# Patient Record
Sex: Male | Born: 1940 | Race: White | Hispanic: No | Marital: Married | Smoking: Former smoker
Health system: Southern US, Community
[De-identification: ages and names within clinical notes are randomized; demographics above are authoritative.]

## PROBLEM LIST (undated history)

## (undated) DIAGNOSIS — F1010 Alcohol abuse, uncomplicated: Secondary | ICD-10-CM

## (undated) DIAGNOSIS — R918 Other nonspecific abnormal finding of lung field: Secondary | ICD-10-CM

## (undated) DIAGNOSIS — I10 Essential (primary) hypertension: Secondary | ICD-10-CM

## (undated) DIAGNOSIS — H9190 Unspecified hearing loss, unspecified ear: Secondary | ICD-10-CM

## (undated) DIAGNOSIS — Z5111 Encounter for antineoplastic chemotherapy: Secondary | ICD-10-CM

## (undated) DIAGNOSIS — C801 Malignant (primary) neoplasm, unspecified: Secondary | ICD-10-CM

## (undated) HISTORY — DX: Alcohol abuse, uncomplicated: F10.10

## (undated) HISTORY — PX: HERNIA REPAIR: SHX51

## (undated) HISTORY — DX: Encounter for antineoplastic chemotherapy: Z51.11

## (undated) HISTORY — DX: Malignant (primary) neoplasm, unspecified: C80.1

## (undated) HISTORY — DX: Other nonspecific abnormal finding of lung field: R91.8

## (undated) HISTORY — DX: Unspecified hearing loss, unspecified ear: H91.90

---

## 2002-05-21 ENCOUNTER — Encounter: Payer: Self-pay | Admitting: Emergency Medicine

## 2002-05-22 ENCOUNTER — Inpatient Hospital Stay (HOSPITAL_COMMUNITY): Admission: EM | Admit: 2002-05-22 | Discharge: 2002-05-23 | Payer: Self-pay | Admitting: Emergency Medicine

## 2003-06-13 ENCOUNTER — Encounter: Admission: RE | Admit: 2003-06-13 | Discharge: 2003-06-13 | Payer: Self-pay | Admitting: Otolaryngology

## 2003-06-30 ENCOUNTER — Ambulatory Visit (HOSPITAL_COMMUNITY): Admission: RE | Admit: 2003-06-30 | Discharge: 2003-06-30 | Payer: Self-pay | Admitting: Otolaryngology

## 2003-08-21 ENCOUNTER — Emergency Department (HOSPITAL_COMMUNITY): Admission: EM | Admit: 2003-08-21 | Discharge: 2003-08-21 | Payer: Self-pay | Admitting: Emergency Medicine

## 2003-09-22 ENCOUNTER — Ambulatory Visit (HOSPITAL_COMMUNITY): Admission: RE | Admit: 2003-09-22 | Discharge: 2003-09-22 | Payer: Self-pay | Admitting: Gastroenterology

## 2003-10-29 ENCOUNTER — Ambulatory Visit: Payer: Self-pay | Admitting: Internal Medicine

## 2003-12-30 ENCOUNTER — Ambulatory Visit: Payer: Self-pay | Admitting: Internal Medicine

## 2003-12-30 ENCOUNTER — Ambulatory Visit (HOSPITAL_COMMUNITY): Admission: RE | Admit: 2003-12-30 | Discharge: 2003-12-30 | Payer: Self-pay | Admitting: Internal Medicine

## 2004-02-09 ENCOUNTER — Ambulatory Visit: Payer: Self-pay | Admitting: Internal Medicine

## 2004-02-20 ENCOUNTER — Ambulatory Visit: Payer: Self-pay | Admitting: Internal Medicine

## 2012-05-28 ENCOUNTER — Emergency Department (HOSPITAL_COMMUNITY)
Admission: EM | Admit: 2012-05-28 | Discharge: 2012-05-28 | Disposition: A | Discharge status: Home / Self Care | Payer: BLUE CROSS/BLUE SHIELD | Source: Home / Self Care

## 2012-05-29 ENCOUNTER — Emergency Department (HOSPITAL_COMMUNITY)
Admission: EM | Admit: 2012-05-29 | Discharge: 2012-05-29 | Disposition: A | Discharge status: Home / Self Care | Payer: BLUE CROSS/BLUE SHIELD | Source: Home / Self Care | Attending: Family Medicine | Admitting: Family Medicine

## 2012-05-29 ENCOUNTER — Encounter (HOSPITAL_COMMUNITY): Payer: Self-pay

## 2012-05-29 DIAGNOSIS — M1990 Unspecified osteoarthritis, unspecified site: Secondary | ICD-10-CM | POA: Diagnosis present

## 2012-05-29 LAB — COMPREHENSIVE METABOLIC PANEL
ALT: 25 U/L (ref 0–53)
AST: 20 U/L (ref 0–37)
Albumin: 3.9 g/dL (ref 3.5–5.2)
Alkaline Phosphatase: 49 U/L (ref 39–117)
BUN: 20 mg/dL (ref 6–23)
CO2: 27 mEq/L (ref 19–32)
Calcium: 9.3 mg/dL (ref 8.4–10.5)
Chloride: 104 mEq/L (ref 96–112)
Creatinine, Ser: 0.86 mg/dL (ref 0.50–1.35)
GFR calc Af Amer: >90 mL/min (ref >90)
GFR calc non Af Amer: 83 mL/min — ABNORMAL LOW (ref >90)
Glucose, Bld: 97 mg/dL (ref 70–99)
Potassium: 4.3 mEq/L (ref 3.5–5.1)
Sodium: 139 mEq/L (ref 135–145)
Total Bilirubin: 0.3 mg/dL (ref 0.3–1.2)
Total Protein: 7.6 g/dL (ref 6.0–8.3)

## 2012-05-29 LAB — CBC
HCT: 43.8 % (ref 39.0–52.0)
Hemoglobin: 15.4 g/dL (ref 13.0–17.0)
MCH: 30.3 pg (ref 26.0–34.0)
MCHC: 35.2 g/dL (ref 30.0–36.0)
MCV: 86.2 fL (ref 78.0–100.0)
Platelets: 287 10*3/uL (ref 150–400)
RBC: 5.08 MIL/uL (ref 4.22–5.81)
RDW: 13.6 % (ref 11.5–15.5)
WBC: 9.3 10*3/uL (ref 4.0–10.5)

## 2012-05-29 LAB — SEDIMENTATION RATE: Sed Rate: 7 mm/hr (ref 0–16)

## 2012-05-29 MED ORDER — TRAMADOL-ACETAMINOPHEN 37.5-325 MG PO TABS: 1.0000 | ORAL_TABLET | Freq: Three times a day (TID) | ORAL | Status: DC | PRN

## 2012-05-29 NOTE — ED Provider Notes (Addendum)
History    CSN: [id number]  Arrival date & time 05/29/12  1628   First MD Initiated Contact with Patient 05/29/12 1800     Chief Complaint  Patient presents with  . Arthritis    (Consider location/radiation/quality/duration/timing/severity/associated sxs/prior treatment) HPI Pt reports that he has difficult with arthritis pain that he has been having for many years.  He is not taking any medications for it at this time but reports that he did take medications in the past.  He says that he had been going to [facility] in the past but reports that he is not going there any more.  Pain is worse in lower back, hips, fingers, wrist and hands.   History reviewed. No pertinent past medical history.  History reviewed. No pertinent past surgical history.  No family history on file.  History  Substance Use Topics  . Smoking status: Not on file  . Smokeless tobacco: Not on file  . Alcohol Use: Not on file    Review of Systems  Musculoskeletal: Positive for back pain, joint swelling and arthralgias.  All other systems reviewed and are negative.    Allergies  Review of patient's allergies indicates no known allergies.  Home Medications  No current outpatient prescriptions on file.  BP 136/74  Pulse 81  Temp(Src) 98.7 F (37.1 C) (Oral)  Resp 18  SpO2 95%  Physical Exam Nursing note and vitals reviewed.  Constitutional: He is oriented to person, place, and time. He appears well-developed and well-nourished. No distress.  Eyes: Conjunctivae and EOM are normal. Pupils are equal, round, and reactive to light.  Neck: Normal range of motion. Neck supple. No JVD present. No thyromegaly present.  Cardiovascular: Normal rate, regular rhythm and normal heart sounds.  No murmur heard.  Pulmonary/Chest: Effort normal and breath sounds normal. No respiratory distress.  Abdominal: Soft. Bowel sounds are normal.  Musculoskeletal: pain in lower back and arthritic  changes in fingers and hands  Lymphadenopathy:  He has no cervical adenopathy.  Neurological: He is oriented to person, place, and time. Coordination normal.  Skin: Skin is warm and dry. No rash noted. No erythema. No pallor.  Psychiatric: He has a normal mood and affect. His behavior is normal. Judgment and thought content normal.    ED Course  Procedures (including critical care time)  Labs Reviewed - No data to display No results found.   No diagnosis found.  MDM  IMPRESSION  Osteoarthritis Pain - diffuse   RECOMMENDATIONS / PLAN Trial of ultracet 37.5/325  po every 12 hours prn severe pain Check labs today  Counseling and written information provided   FOLLOW UP 1 month   The patient was given clear instructions to go to ER or return to medical center if symptoms don't improve, worsen or new problems develop.  The patient verbalized understanding.  The patient was told to call to get lab results if they haven't heard anything in the next week.            Cleora Fleet, MD 05/29/12 1807  Cleora Fleet, MD 05/29/12 [phone]

## 2012-05-29 NOTE — ED Notes (Signed)
Patient is here for history of arthritis

## 2012-05-30 ENCOUNTER — Telehealth (HOSPITAL_COMMUNITY): Payer: Self-pay

## 2012-05-30 NOTE — Progress Notes (Signed)
Quick Note:  Please inform patient that his labs came back normal.   Rodney Langton, MD, CDE, FAAFP [facility] [place], [place]   ______

## 2012-05-30 NOTE — ED Notes (Signed)
Lab results given

## 2012-06-18 MED ORDER — TRAMADOL-ACETAMINOPHEN 37.5-325 MG PO TABS: 1.0000 | ORAL_TABLET | Freq: Three times a day (TID) | ORAL | Status: DC | PRN

## 2012-06-28 ENCOUNTER — Emergency Department (HOSPITAL_COMMUNITY)
Admission: EM | Admit: 2012-06-28 | Discharge: 2012-06-28 | Disposition: A | Discharge status: Home Health | Payer: BLUE CROSS/BLUE SHIELD | Source: Home / Self Care

## 2012-06-28 ENCOUNTER — Encounter (HOSPITAL_COMMUNITY): Payer: Self-pay

## 2012-06-28 DIAGNOSIS — IMO0002 Reserved for concepts with insufficient information to code with codable children: Secondary | ICD-10-CM

## 2012-06-28 DIAGNOSIS — M5416 Radiculopathy, lumbar region: Secondary | ICD-10-CM

## 2012-06-28 DIAGNOSIS — M1990 Unspecified osteoarthritis, unspecified site: Secondary | ICD-10-CM

## 2012-06-28 NOTE — ED Provider Notes (Signed)
History     CSN: [id number]  Arrival date & time 06/28/12  1610   None     No chief complaint on file.  72 year old male who presents to [facility] for followup of his arthritis. The patient primarily had pain in his back and is bilateral lower extremities. The patient has been taking Ultracet for the pain at least one to 2 tablets a day and he feels a lot better. The patient states that he has been more active in the last couple of weeks which has also helped him with the pain. He is trying to bicycle on a daily basis. He states that he has no other complaints    HPI  History reviewed. No pertinent past medical history.  History reviewed. No pertinent past surgical history.  History reviewed. No pertinent family history.  History  Substance Use Topics  . Smoking status: Not on file  . Smokeless tobacco: Not on file  . Alcohol Use: Not on file      Review of Systems As in history of present illness Allergies  Review of patient's allergies indicates no known allergies.  Home Medications   Current Outpatient Rx  Name  Route  Sig  Dispense  Refill  . traMADol-acetaminophen (ULTRACET) 37.5-325 MG per tablet   Oral   Take 1 tablet by mouth every 8 (eight) hours as needed for pain.   30 tablet   2     BP 120/68  Pulse 76  Temp(Src) 98.6 F (37 C) (Oral)  Resp 20  SpO2 96%  Physical Exam Cardiovascular: Normal rate, regular rhythm and normal heart sounds.  No murmur heard.  Pulmonary/Chest: Effort normal and breath sounds normal. No respiratory distress.  Abdominal: Soft. Bowel sounds are normal.  Musculoskeletal: pain in lower back and arthritic changes in fingers and hands  Lymphadenopathy:  He has no cervical adenopathy.  Neurological: He is oriented to person, place, and time. Coordination normal.  Skin: Skin is warm and dry. No rash noted. No erythema. No pallor.  Psychiatric: He has a normal mood and affect. His behavior is normal. Judgment and thought content  normal.     ED Course  Procedures (including critical care time)  Labs Reviewed - No data to display No results found.   1. Lumbar radiculopathy, chronic   2. Osteoarthritis       MDM  Plan Continue Ultracet Patient has 2 refills on this medication Routine followup in 2 months as the patient is doing well        Richarda Overlie, MD 06/28/12 1714

## 2012-06-28 NOTE — ED Notes (Signed)
Follow up for back and leg pain. Patient stated he feel much better pain medication helps a lot.

## 2012-10-10 ENCOUNTER — Other Ambulatory Visit: Payer: Self-pay | Admitting: Internal Medicine

## 2012-10-10 ENCOUNTER — Telehealth: Payer: Self-pay | Admitting: Internal Medicine

## 2012-10-10 MED ORDER — TRAMADOL-ACETAMINOPHEN 37.5-325 MG PO TABS: 1.0000 | ORAL_TABLET | Freq: Three times a day (TID) | ORAL | Status: DC | PRN

## 2012-10-10 NOTE — Telephone Encounter (Signed)
Pt here, would like refill for traMADol-acetaminophen (ULTRACET) 37.5-325 MG per tablet.  Pt last seen on 06/28/12.

## 2012-10-18 ENCOUNTER — Encounter: Payer: Self-pay | Admitting: Internal Medicine

## 2012-10-18 ENCOUNTER — Ambulatory Visit: Payer: Medicare Other | Attending: Internal Medicine | Admitting: Internal Medicine

## 2012-10-18 VITALS — BP 140/81 | HR 79 | Temp 98.6°F | Resp 16 | Ht 69.0 in | Wt 168.0 lb

## 2012-10-18 DIAGNOSIS — M545 Low back pain, unspecified: Secondary | ICD-10-CM | POA: Insufficient documentation

## 2012-10-18 DIAGNOSIS — G8929 Other chronic pain: Secondary | ICD-10-CM | POA: Insufficient documentation

## 2012-10-18 MED ORDER — TRAMADOL-ACETAMINOPHEN 37.5-325 MG PO TABS: 1.0000 | ORAL_TABLET | Freq: Three times a day (TID) | ORAL | Status: DC | PRN

## 2012-10-18 NOTE — Progress Notes (Signed)
PT HERE WITH C/O CHRONIC HIP PAIN RADIATING ALL OVER  HX OSTEOARTHRITIS

## 2012-10-18 NOTE — Progress Notes (Signed)
Patient ID: Derek Howard, male   DOB: 05/18/1940, 72 y.o.   MRN: [id number]  CC: Followup  HPI: Derek Howard is 72 years old gentleman who came to clinic today for refill of his medications. He has history of arthritis and chronic low back pain for which she's been on medication for long time. He has no significant medical history of hypertension or diabetes. He lives at home with his wife. Denies any new symptoms, denies any weakness or numbness. He ran out of medication and the pain has been keeping him awake lately.  No Known Allergies History reviewed. No pertinent past medical history. No current outpatient prescriptions on file prior to visit.   No current facility-administered medications on file prior to visit.   History reviewed. No pertinent family history. History   Social History  . Marital Status: Single    Spouse Name: N/A    Number of Children: N/A  . Years of Education: N/A   Occupational History  . Not on file.   Social History Main Topics  . Smoking status: Never Smoker   . Smokeless tobacco: Not on file  . Alcohol Use: No  . Drug Use: No  . Sexual Activity: Not on file   Other Topics Concern  . Not on file   Social History Narrative  . No narrative on file    Review of Systems: Constitutional: Negative for fever, chills, diaphoresis, activity change, appetite change and fatigue. HENT: Negative for ear pain, nosebleeds, congestion, facial swelling, rhinorrhea, neck pain, neck stiffness and ear discharge.  Eyes: Negative for pain, discharge, redness, itching and visual disturbance. Respiratory: Negative for cough, choking, chest tightness, shortness of breath, wheezing and stridor.  Cardiovascular: Negative for chest pain, palpitations and leg swelling. Gastrointestinal: Negative for abdominal distention. Genitourinary: Negative for dysuria, urgency, frequency, hematuria, flank pain, decreased urine volume, difficulty urinating and dyspareunia.   Musculoskeletal: Negative for back pain, joint swelling, arthralgias and gait problem. Neurological: Negative for dizziness, tremors, seizures, syncope, facial asymmetry, speech difficulty, weakness, light-headedness, numbness and headaches.  Hematological: Negative for adenopathy. Does not bruise/bleed easily. Psychiatric/Behavioral: Negative for hallucinations, behavioral problems, confusion, dysphoric mood, decreased concentration and agitation.    Objective:   Filed Vitals:   10/18/12 1537  BP: 140/81  Pulse: 79  Temp: 98.6 F (37 C)  Resp: 16    Physical Exam: Constitutional: Patient appears well-developed and well-nourished. No distress. HENT: Normocephalic, atraumatic, External right and left ear normal. Oropharynx is clear and moist.  Eyes: Conjunctivae and EOM are normal. PERRLA, no scleral icterus. Neck: Normal ROM. Neck supple. No JVD. No tracheal deviation. No thyromegaly. CVS: RRR, S1/S2 +, no murmurs, no gallops, no carotid bruit.  Pulmonary: Effort and breath sounds normal, no stridor, rhonchi, wheezes, rales.  Abdominal: Soft. BS +,  no distension, tenderness, rebound or guarding.  Musculoskeletal: Normal range of motion. No edema and no tenderness.  Lymphadenopathy: No lymphadenopathy noted, cervical, inguinal or axillary Neuro: Alert. Normal reflexes, muscle tone coordination. No cranial nerve deficit. Skin: Skin is warm and dry. No rash noted. Not diaphoretic. No erythema. No pallor. Psychiatric: Normal mood and affect. Behavior, judgment, thought content normal.  Lab Results  Component Value Date   WBC 9.3 05/29/2012   HGB 15.4 05/29/2012   HCT 43.8 05/29/2012   MCV 86.2 05/29/2012   PLT 287 05/29/2012   Lab Results  Component Value Date   CREATININE 0.86 05/29/2012   BUN 20 05/29/2012   NA 139 05/29/2012   K 4.3  05/29/2012   CL 104 05/29/2012   CO2 27 05/29/2012    No results found for this basename: HGBA1C   Lipid Panel  No results found for this basename: chol,  trig, hdl, cholhdl, vldl, ldlcalc       Assessment and plan:   Patient Active Problem List   Diagnosis Date Noted  . Chronic low back pain 10/18/2012  . Osteoarthritis 05/29/2012   Refill medication trauma-acetaminophen 37.5/325 mg per tab use 1 tablet Q8 hour when necessary for pain Patient extensively counseled about this medication and its side effects Patient encouraged to do exercise as tolerated  Derek Howard was given clear instructions to go to ER or return to the clinic if symptoms don't improve, worsen or new problems develop.  Derek Howard verbalized understanding.  Derek Howard was told to call to get lab results if hasn't heard anything in the next week.        Jeanann Lewandowsky, MD [facility] [place], [place] [phone]   10/18/2012, 4:28 PM

## 2012-11-14 NOTE — Telephone Encounter (Signed)
Office visit on 10/18/12.

## 2013-10-16 ENCOUNTER — Other Ambulatory Visit: Payer: Self-pay | Admitting: Internal Medicine

## 2013-10-17 ENCOUNTER — Telehealth: Payer: Self-pay

## 2013-10-17 NOTE — Telephone Encounter (Signed)
Patient calling requesting a refill on his tramadol Can we refill this?

## 2013-10-18 ENCOUNTER — Ambulatory Visit (INDEPENDENT_AMBULATORY_CARE_PROVIDER_SITE_OTHER): Payer: Medicare Other

## 2013-10-18 ENCOUNTER — Ambulatory Visit (INDEPENDENT_AMBULATORY_CARE_PROVIDER_SITE_OTHER): Payer: Medicare Other | Admitting: Family Medicine

## 2013-10-18 ENCOUNTER — Telehealth: Payer: Self-pay | Admitting: Internal Medicine

## 2013-10-18 VITALS — BP 128/84 | HR 84 | Temp 98.7°F | Resp 16 | Ht 66.5 in | Wt 167.0 lb

## 2013-10-18 DIAGNOSIS — M5136 Other intervertebral disc degeneration, lumbar region: Secondary | ICD-10-CM

## 2013-10-18 DIAGNOSIS — M545 Low back pain, unspecified: Secondary | ICD-10-CM

## 2013-10-18 DIAGNOSIS — M25551 Pain in right hip: Secondary | ICD-10-CM

## 2013-10-18 DIAGNOSIS — C4491 Basal cell carcinoma of skin, unspecified: Secondary | ICD-10-CM

## 2013-10-18 DIAGNOSIS — M51379 Other intervertebral disc degeneration, lumbosacral region without mention of lumbar back pain or lower extremity pain: Secondary | ICD-10-CM

## 2013-10-18 DIAGNOSIS — M25559 Pain in unspecified hip: Secondary | ICD-10-CM

## 2013-10-18 DIAGNOSIS — M5137 Other intervertebral disc degeneration, lumbosacral region: Secondary | ICD-10-CM

## 2013-10-18 DIAGNOSIS — L989 Disorder of the skin and subcutaneous tissue, unspecified: Secondary | ICD-10-CM

## 2013-10-18 DIAGNOSIS — G8929 Other chronic pain: Secondary | ICD-10-CM

## 2013-10-18 MED ORDER — TRAMADOL-ACETAMINOPHEN 37.5-325 MG PO TABS: 1.0000 | ORAL_TABLET | Freq: Three times a day (TID) | ORAL | Status: DC | PRN

## 2013-10-18 MED ORDER — OMEPRAZOLE 40 MG PO CPDR: 40.0000 mg | DELAYED_RELEASE_CAPSULE | Freq: Every day | ORAL | Status: DC

## 2013-10-18 NOTE — Patient Instructions (Signed)
1.  IT IS VERY IMPORTANT TO FOLLOW-UP WITH YOUR PRIMARY CARE PHYSICIAN IN THE UPCOMING TWO WEEKS. 2. YOU NEED TO SCHEDULE AN ANNUAL WELLNESS EXAM WITH YOUR PRIMARY CARE PROVIDER.

## 2013-10-18 NOTE — Progress Notes (Signed)
Subjective:    Patient ID: Derek Howard, male    DOB: 28-Jun-1940, 74 y.o.   MRN: [id number]  10/18/2013  Knee Pain and Back Pain   HPI This 74 y.o. male presents for evaluation of chronic lower back pain and R hip pain and B knee pain.  Taking Tramadol/APAP 37.5-325mg  one as needed for pain.  OA hips and back and knees.  Followed at [facility] for primary care but last appointment with [facility] one year ago; attempted to be seen today by PCP but no appointments available; has been scheduled for 10-29-13 with PCP.  Having horrible nighttime pain in lower back; wife reports that pt has not slept in three nights.  Worsening lower back pain for over one year. Denies radiation into legs; no n/t/w in legs; no saddle paresthesias; no b/b dysfunction but does report nocturia frequently.    No orthopedist evaluation in past..  Has suffered with OA for ten or fifteen years. Last ortho evaluation 1997 or 1998.  Last xrays of lower back fifteen years ago.  Presents with handicap sticker for completion.  Basal cell carcinoma:  Has had several skin cancers removed from various locations.  No specific dermatologist.  Not followed regularly by dermatology.  Has growth on nose that pt states he is sure is a skin cancer; needs to undergo repeat dermatology evaluation.    Health Maintenance: does not go for regular annual wellness examinations.     Review of Systems  Constitutional: Negative for fever, chills, diaphoresis and fatigue.  HENT: Positive for hearing loss.   Genitourinary: Positive for frequency.  Musculoskeletal: Positive for arthralgias, back pain, gait problem and myalgias. Negative for joint swelling, neck pain and neck stiffness.  Skin: Positive for wound.  Neurological: Negative for weakness and numbness.    Past Medical History  Diagnosis Date  . Cancer     basal cell carcinoma  . Hearing loss    History reviewed. No pertinent past surgical history. No Known Allergies Current  Outpatient Prescriptions  Medication Sig Dispense Refill  . omeprazole (PRILOSEC) 40 MG capsule Take 1 capsule (40 mg total) by mouth daily.  30 capsule  3  . traMADol-acetaminophen (ULTRACET) 37.5-325 MG per tablet Take 1 tablet by mouth every 8 (eight) hours as needed.  60 tablet  0   No current facility-administered medications for this visit.   History   Social History  . Marital Status: Married    Spouse Name: N/A    Number of Children: N/A  . Years of Education: N/A   Occupational History  . Not on file.   Social History Main Topics  . Smoking status: Never Smoker   . Smokeless tobacco: Not on file  . Alcohol Use: No  . Drug Use: No  . Sexual Activity: Not on file   Other Topics Concern  . Not on file   Social History Narrative   Marital status:  Married x 1979.      Children:  2 children      Lives: with wife, one child (46)     Employment:  Retired      Tobacco:  None; quit       Alcohol:  None             Objective:    BP 128/84  Pulse 84  Temp(Src) 98.7 F (37.1 C)  Resp 16  Ht 5' 6.5" (1.689 m)  Wt 167 lb (75.751 kg)  BMI 26.55 kg/m2  SpO2  98% Physical Exam  Nursing note and vitals reviewed. Constitutional: He is oriented to person, place, and time. He appears well-developed and well-nourished. No distress.  HENT:  Head: Normocephalic and atraumatic.  Eyes: Conjunctivae and EOM are normal. Pupils are equal, round, and reactive to light.  Neck: Normal range of motion. Neck supple. Carotid bruit is not present. No thyromegaly present.  Cardiovascular: Normal rate, regular rhythm, normal heart sounds and intact distal pulses.  Exam reveals no gallop and no friction rub.   No murmur heard. Pulmonary/Chest: Effort normal and breath sounds normal. He has no wheezes. He has no rales.  Musculoskeletal:       Right hip: He exhibits normal range of motion, normal strength, no tenderness, no bony tenderness and no swelling.       Right knee: He exhibits  normal range of motion, no swelling and no effusion. No tenderness found. No medial joint line and no lateral joint line tenderness noted.       Left knee: He exhibits normal range of motion, no swelling and no effusion. No tenderness found. No medial joint line and no lateral joint line tenderness noted.       Lumbar back: He exhibits decreased range of motion and pain. He exhibits no tenderness, no bony tenderness, no spasm and normal pulse.  Lymphadenopathy:    He has cervical adenopathy.       Left cervical: Superficial cervical adenopathy present. No posterior cervical adenopathy present.  Neurological: He is alert and oriented to person, place, and time. No cranial nerve deficit.  Skin: Skin is warm and dry. Lesion noted. No rash noted. He is not diaphoretic. No pallor.  Distal nose with 3mm diameter growth with central eschar 68mm. No active bleeding.   Psychiatric: He has a normal mood and affect. His behavior is normal.   Results for orders placed during the hospital encounter of 05/29/12  CBC      Result Value Ref Range   WBC 9.3  4.0 - 10.5 K/uL   RBC 5.08  4.22 - 5.81 MIL/uL   Hemoglobin 15.4  13.0 - 17.0 g/dL   HCT 43.8  39.0 - 52.0 %   MCV 86.2  78.0 - 100.0 fL   MCH 30.3  26.0 - 34.0 pg   MCHC 35.2  30.0 - 36.0 g/dL   RDW 13.6  11.5 - 15.5 %   Platelets 287  150 - 400 K/uL  COMPREHENSIVE METABOLIC PANEL      Result Value Ref Range   Sodium 139  135 - 145 mEq/L   Potassium 4.3  3.5 - 5.1 mEq/L   Chloride 104  96 - 112 mEq/L   CO2 27  19 - 32 mEq/L   Glucose, Bld 97  70 - 99 mg/dL   BUN 20  6 - 23 mg/dL   Creatinine, Ser 0.86  0.50 - 1.35 mg/dL   Calcium 9.3  8.4 - 10.5 mg/dL   Total Protein 7.6  6.0 - 8.3 g/dL   Albumin 3.9  3.5 - 5.2 g/dL   AST 20  0 - 37 U/L   ALT 25  0 - 53 U/L   Alkaline Phosphatase 49  39 - 117 U/L   Total Bilirubin 0.3  0.3 - 1.2 mg/dL   GFR calc non Af Amer 83 (*) >90 mL/min   GFR calc Af Amer >90  >90 mL/min  SEDIMENTATION RATE       Result Value Ref Range   Sed Rate  7  0 - 16 mm/hr   [facility] reading (PRIMARY) by  Dr. Tamala Julian.  LUMBAR SPINE: MODERATE TO SEVERE SPURRING; DEGENERATIVE CHANGES.  L HIP: MILD TO MODERATE  ARTHRITIC CHANGES; MINIMAL R HIP ARTHRITIC CHANGES.      Assessment & Plan:   1. Hip pain, right   2. Chronic low back pain   3. Non-healing skin lesion of nose   4. Basal cell carcinoma   5. Degenerative disc disease, lumbar    1 Chronic lower back pain:  Worsening in the past year.  Refill of Ultracet provided. Refer to ortho.  Has appointment with PCP in upcoming two weeks; further refills of Ultracet will need to be prescribed by PCP.  Advised pt that he will warrant regular follow-up with PCP due to chronic pain and need for ongoing rx. 2.  R hip pain with OA B hips:  New.  Rx for Ultracet provided; good range of motion; refer to ortho. 3.  DDD lumbar spine: moderate to severe; refer to ortho. 4.  Non-healing skin lesion nose:  New. Concerning for cancer; refer to dermatology. 5.  Basal cell carcinoma: history of multiple basal cell cancer resections; no regular follow-up with dermatology; refer to derm. 6. Health Maintenance: highly recommend undergoing Annual Wellness Exam by PCP; does not appear to be up to date with health maintenance.  Meds ordered this encounter  Medications  . omeprazole (PRILOSEC) 40 MG capsule    Sig: Take 1 capsule (40 mg total) by mouth daily.    Dispense:  30 capsule    Refill:  3  . traMADol-acetaminophen (ULTRACET) 37.5-325 MG per tablet    Sig: Take 1 tablet by mouth every 8 (eight) hours as needed.    Dispense:  60 tablet    Refill:  0    Return if symptoms worsen or fail to improve.  Reginia Forts, M.D.  [facility] [address] [place], [place]  [address] [phone] phone [phone] fax

## 2013-10-18 NOTE — Telephone Encounter (Signed)
Pt. Came in as a walk in and was unable to be seen by triage nurse...pt. Has not been since in clinic since 10/18/12....pt. Was upset and was given an appt. For 10/29/13 at 12 with Dr. Annitta Needs...pt. Was requesting pain medication.Derek Howard

## 2013-10-19 ENCOUNTER — Encounter: Payer: Self-pay | Admitting: Family Medicine

## 2013-10-29 ENCOUNTER — Ambulatory Visit: Payer: Medicare Other | Attending: Internal Medicine | Admitting: Internal Medicine

## 2013-10-29 ENCOUNTER — Encounter: Payer: Self-pay | Admitting: Internal Medicine

## 2013-10-29 VITALS — BP 137/74 | HR 76 | Temp 98.3°F | Wt 164.4 lb

## 2013-10-29 DIAGNOSIS — M1610 Unilateral primary osteoarthritis, unspecified hip: Secondary | ICD-10-CM

## 2013-10-29 DIAGNOSIS — M169 Osteoarthritis of hip, unspecified: Secondary | ICD-10-CM

## 2013-10-29 DIAGNOSIS — M47816 Spondylosis without myelopathy or radiculopathy, lumbar region: Secondary | ICD-10-CM

## 2013-10-29 DIAGNOSIS — M1612 Unilateral primary osteoarthritis, left hip: Secondary | ICD-10-CM

## 2013-10-29 DIAGNOSIS — M545 Low back pain, unspecified: Secondary | ICD-10-CM

## 2013-10-29 DIAGNOSIS — G8929 Other chronic pain: Secondary | ICD-10-CM

## 2013-10-29 DIAGNOSIS — M47817 Spondylosis without myelopathy or radiculopathy, lumbosacral region: Secondary | ICD-10-CM

## 2013-10-29 MED ORDER — IBUPROFEN 600 MG PO TABS: 600.0000 mg | ORAL_TABLET | Freq: Three times a day (TID) | ORAL | Status: DC | PRN

## 2013-10-29 NOTE — Progress Notes (Signed)
Patient is here today for a pain medication follow up. State that his pain is fairly severe 8/10 He has a hanidcap form and a cd of his disc for doctor to address.

## 2013-10-29 NOTE — Progress Notes (Signed)
MRN: [id number] Name: Derek Howard  Sex: male Age: 73 y.o. DOB: [birth year]  Allergies: Review of patient's allergies indicates no known allergies.  Chief Complaint  Patient presents with  . Medication Refill    HPI: Patient is 73 y.o. male who has history of chronic low back pain osteoarthritis comes today requesting pain medication, EMR reviewed [facility], had x-ray of lower back as well as hips done reported lower lumbar DJD and osteoarthritis of left hip, patient was prescribed Ultracet and is not due for refill, patient also wants the form to be filled for handicap sticker, patient has not seen any orthopedics, he was advised to call and make an appointment, also patient is scheduled to follow with a dermatologist, patient denies any incontinence numbness or weakness.  Past Medical History  Diagnosis Date  . Cancer     basal cell carcinoma  . Hearing loss     No past surgical history on file.    Medication List       This list is accurate as of: 10/29/13 12:34 PM.  Always use your most recent med list.               ibuprofen 600 MG tablet  Commonly known as:  ADVIL,MOTRIN  Take 1 tablet (600 mg total) by mouth every 8 (eight) hours as needed.     omeprazole 40 MG capsule  Commonly known as:  PRILOSEC  Take 1 capsule (40 mg total) by mouth daily.     traMADol-acetaminophen 37.5-325 MG per tablet  Commonly known as:  ULTRACET  Take 1 tablet by mouth every 8 (eight) hours as needed.        Meds ordered this encounter  Medications  . ibuprofen (ADVIL,MOTRIN) 600 MG tablet    Sig: Take 1 tablet (600 mg total) by mouth every 8 (eight) hours as needed.    Dispense:  30 tablet    Refill:  1     There is no immunization history on file for this patient.  No family history on file.  History  Substance Use Topics  . Smoking status: Never Smoker   . Smokeless tobacco: Not on file  . Alcohol Use: No    Review of  Systems   As noted in HPI  Filed Vitals:   10/29/13 1159  BP: 137/74  Pulse: 76  Temp: 98.3 F (36.8 C)    Physical Exam  Physical Exam  Constitutional: No distress.  Cardiovascular: Normal rate and regular rhythm.   Pulmonary/Chest: Breath sounds normal. No respiratory distress. He has no wheezes. He has no rales.  Abdominal: Soft. There is no tenderness.  Musculoskeletal:  Some lower lumbar spinal and paraspinal tenderness, SLR test negative equal strength both lower extremities.    CBC    Component Value Date/Time   WBC 9.3 05/29/2012 1808   RBC 5.08 05/29/2012 1808   HGB 15.4 05/29/2012 1808   HCT 43.8 05/29/2012 1808   PLT 287 05/29/2012 1808   MCV 86.2 05/29/2012 1808    CMP     Component Value Date/Time   NA 139 05/29/2012 1808   K 4.3 05/29/2012 1808   CL 104 05/29/2012 1808   CO2 27 05/29/2012 1808   GLUCOSE 97 05/29/2012 1808   BUN 20 05/29/2012 1808   CREATININE 0.86 05/29/2012 1808   CALCIUM 9.3 05/29/2012 1808   PROT 7.6 05/29/2012 1808   ALBUMIN 3.9 05/29/2012 1808   AST  20 05/29/2012 1808   ALT 25 05/29/2012 1808   ALKPHOS 49 05/29/2012 1808   BILITOT 0.3 05/29/2012 1808   GFRNONAA 83* 05/29/2012 1808   GFRAA >90 05/29/2012 1808    No results found for this basename: chol, tri, ldl    No components found with this basename: hga1c    Lab Results  Component Value Date/Time   AST 20 05/29/2012  6:08 PM    Assessment and Plan  Chronic low back pain/Lumbar spondylosis, unspecified spinal osteoarthritis  - Plan: ibuprofen (ADVIL,MOTRIN) 600 MG tablet  Patient is advised to take her pain medication is when necessary he was recently given Ultracet and is not due for refill, prescribed ibuprofen, patient needs to see an orthopedics has already been referred and is going to make appointment.  Osteoarthritis of left hip, unspecified osteoarthritis type - Plan: ibuprofen (ADVIL,MOTRIN) 600 MG tablet  Patient to come back for regular follow up with his PCP in 3 months.  Lorayne Marek, MD

## 2014-01-07 ENCOUNTER — Ambulatory Visit: Payer: Medicare Other

## 2014-01-13 ENCOUNTER — Ambulatory Visit: Payer: Medicare Other | Attending: Internal Medicine | Admitting: *Deleted

## 2014-01-13 ENCOUNTER — Ambulatory Visit (HOSPITAL_BASED_OUTPATIENT_CLINIC_OR_DEPARTMENT_OTHER): Payer: Medicare Other | Admitting: *Deleted

## 2014-01-13 DIAGNOSIS — Z23 Encounter for immunization: Secondary | ICD-10-CM

## 2014-04-11 ENCOUNTER — Telehealth: Payer: Self-pay | Admitting: Emergency Medicine

## 2014-04-11 NOTE — Telephone Encounter (Signed)
Patients handicap park placard is complete and ready for pick-up. Left message.

## 2014-04-24 ENCOUNTER — Ambulatory Visit: Payer: Medicare HMO | Attending: Internal Medicine | Admitting: Internal Medicine

## 2014-04-24 ENCOUNTER — Encounter: Payer: Self-pay | Admitting: Internal Medicine

## 2014-04-24 VITALS — BP 146/77 | HR 78 | Temp 98.3°F | Resp 18 | Ht 68.0 in | Wt 174.0 lb

## 2014-04-24 DIAGNOSIS — M545 Low back pain: Secondary | ICD-10-CM | POA: Diagnosis not present

## 2014-04-24 DIAGNOSIS — R21 Rash and other nonspecific skin eruption: Secondary | ICD-10-CM

## 2014-04-24 DIAGNOSIS — Z23 Encounter for immunization: Secondary | ICD-10-CM | POA: Diagnosis not present

## 2014-04-24 DIAGNOSIS — G8929 Other chronic pain: Secondary | ICD-10-CM | POA: Diagnosis not present

## 2014-04-24 MED ORDER — TRAMADOL HCL 50 MG PO TABS: 50.0000 mg | ORAL_TABLET | Freq: Three times a day (TID) | ORAL | Status: DC | PRN

## 2014-04-24 NOTE — Progress Notes (Signed)
Patient ID: PAVAN BRING, male   DOB: 24-Jan-1941, 74 y.o.   MRN: [id number]   Derek Howard, is a 74 y.o. male  UXN:[id number]  URK:[id number]  DOB - [birth year]  Chief Complaint  Patient presents with  . Follow-up  . Referral  . Hip Pain        Subjective:   Derek Howard is a 74 y.o. male here today for a follow up visit. Patient has history of basal cell carcinoma and chronic hearing loss here today for referral to dermatologist for dry skin of body rash that has been ongoing for months. His other medical history includes chronic low back pain and osteoarthritis. 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 Patient was on Ultracet for pain. He is requesting for tramadol for pain today. He has no other complaint. Activity of daily living is intact. Patient has No headache, No chest pain, No abdominal pain - No Nausea, No new weakness tingling or numbness, No Cough - SOB.  Problem  Rash    ALLERGIES: No Known Allergies  PAST MEDICAL HISTORY: Past Medical History  Diagnosis Date  . Cancer     basal cell carcinoma  . Hearing loss     MEDICATIONS AT HOME: Prior to Admission medications   Medication Sig Start Date End Date Taking? Authorizing Provider  omeprazole (PRILOSEC) 40 MG capsule Take 1 capsule (40 mg total) by mouth daily. 10/18/13  Yes Leandrew Koyanagi, MD  ibuprofen (ADVIL,MOTRIN) 600 MG tablet Take 1 tablet (600 mg total) by mouth every 8 (eight) hours as needed. Patient not taking: Reported on 04/24/2014 10/29/13   Lorayne Marek, MD  traMADol (ULTRAM) 50 MG tablet Take 1 tablet (50 mg total) by mouth every 8 (eight) hours as needed. 04/24/14   Tresa Garter, MD  traMADol-acetaminophen (ULTRACET) 37.5-325 MG per tablet Take 1 tablet by mouth every 8 (eight) hours as needed. Patient not taking: Reported on 04/24/2014 10/18/13   Wardell Honour, MD     Objective:   Filed Vitals:   04/24/14 1645  BP: 146/77  Pulse: 78  Temp: 98.3 F (36.8 C)  TempSrc: Oral  Resp: 18    Height: 5\' 8"  (1.727 m)  Weight: 174 lb (78.926 kg)  SpO2: 96%    Exam General appearance : Awake, alert, not in any distress. Speech Clear. Not toxic looking, elderly HEENT: Atraumatic and Normocephalic, pupils equally reactive to light and accomodation Neck: supple, no JVD. No cervical lymphadenopathy.  Chest:Good air entry bilaterally, no added sounds  CVS: S1 S2 regular, no murmurs.  Abdomen: Bowel sounds present, Non tender and not distended with no gaurding, rigidity or rebound. Extremities: B/L Lower Ext shows no edema, both legs are warm to touch Neurology: Awake alert, and oriented X 3, CN II-XII intact, Non focal Skin:  generalized dry scaly skin with flakiness Wounds:N/A  Data Review No results found for: HGBA1C   Assessment & Plan   1. Chronic low back pain  - traMADol (ULTRAM) 50 MG tablet; Take 1 tablet (50 mg total) by mouth every 8 (eight) hours as needed.  Dispense: 60 tablet; Refill: 0  2. Rash  - Ambulatory referral to Dermatology  Patient was extensively counseled about nutrition and exercise Return in about 6 months (around 10/25/2014), or if symptoms worsen or fail to improve, for Annual Physical, Routine Follow Up.  The patient was given clear instructions to go to ER or return to medical center if symptoms don't improve, worsen or new problems develop. The  patient verbalized understanding. The patient was told to call to get lab results if they haven't heard anything in the next week.   This note has been created with Surveyor, quantity. Any transcriptional errors are unintentional.    [name], MD, [place], [place], [place] [facility] [place], [place]   04/24/2014, 5:15 PM

## 2014-04-24 NOTE — Progress Notes (Signed)
Pt comes in for Dermatology referral States he missed appt scheduled 01/2014 @ Skin Surgery for lesion on nose s/p basal cell carcinoma C/o chronic low back pain with radiating pain to hips/legs Not taking pain medication

## 2014-06-12 ENCOUNTER — Telehealth: Payer: Self-pay | Admitting: Internal Medicine

## 2014-06-12 NOTE — Telephone Encounter (Signed)
Patient has come in today to ask why he was referred to dermatology and not for surgery; please call patient to inform him of his process and how it works;

## 2014-06-16 NOTE — Telephone Encounter (Signed)
Explained to patient that he had been referred to a dermatologist who would remove the skin lesions or refer him to someone that can.

## 2014-11-04 DIAGNOSIS — H5210 Myopia, unspecified eye: Secondary | ICD-10-CM | POA: Diagnosis not present

## 2014-11-04 DIAGNOSIS — H524 Presbyopia: Secondary | ICD-10-CM | POA: Diagnosis not present

## 2014-12-04 ENCOUNTER — Ambulatory Visit: Payer: Commercial Managed Care - HMO | Attending: Internal Medicine | Admitting: Pharmacist

## 2014-12-04 DIAGNOSIS — Z23 Encounter for immunization: Secondary | ICD-10-CM | POA: Diagnosis not present

## 2014-12-04 MED ORDER — INFLUENZA VAC SPLIT QUAD 0.5 ML IM SUSY
0.5000 mL | PREFILLED_SYRINGE | Freq: Once | INTRAMUSCULAR | Status: AC
Start: 2014-12-04 — End: 2014-12-04
  Administered 2014-12-04: 0.5 mL via INTRAMUSCULAR

## 2014-12-04 NOTE — Patient Instructions (Signed)

## 2015-02-11 NOTE — Telephone Encounter (Signed)
error 

## 2015-02-23 ENCOUNTER — Ambulatory Visit (INDEPENDENT_AMBULATORY_CARE_PROVIDER_SITE_OTHER): Payer: Commercial Managed Care - HMO

## 2015-02-23 ENCOUNTER — Ambulatory Visit (INDEPENDENT_AMBULATORY_CARE_PROVIDER_SITE_OTHER): Payer: Commercial Managed Care - HMO | Admitting: Family Medicine

## 2015-02-23 VITALS — BP 156/78 | HR 83 | Temp 98.5°F | Resp 20 | Ht 68.0 in | Wt 168.5 lb

## 2015-02-23 DIAGNOSIS — M546 Pain in thoracic spine: Secondary | ICD-10-CM | POA: Diagnosis not present

## 2015-02-23 DIAGNOSIS — R918 Other nonspecific abnormal finding of lung field: Secondary | ICD-10-CM

## 2015-02-23 DIAGNOSIS — G8929 Other chronic pain: Secondary | ICD-10-CM | POA: Diagnosis not present

## 2015-02-23 DIAGNOSIS — M545 Low back pain: Secondary | ICD-10-CM

## 2015-02-23 DIAGNOSIS — F341 Dysthymic disorder: Secondary | ICD-10-CM

## 2015-02-23 DIAGNOSIS — M62838 Other muscle spasm: Secondary | ICD-10-CM | POA: Diagnosis not present

## 2015-02-23 DIAGNOSIS — R0789 Other chest pain: Secondary | ICD-10-CM

## 2015-02-23 DIAGNOSIS — I10 Essential (primary) hypertension: Secondary | ICD-10-CM | POA: Diagnosis not present

## 2015-02-23 LAB — POCT CBC
GRANULOCYTE PERCENT: 70.6 % (ref 37–80)
HCT, POC: 38.6 % — AB (ref 43.5–53.7)
Hemoglobin: 13 g/dL — AB (ref 14.1–18.1)
Lymph, poc: 2.3 (ref 0.6–3.4)
MCH: 28.5 pg (ref 27–31.2)
MCHC: 33.7 g/dL (ref 31.8–35.4)
MCV: 84.4 fL (ref 80–97)
MID (CBC): 0.6 (ref 0–0.9)
MPV: 6.9 fL (ref 0–99.8)
PLATELET COUNT, POC: 348 10*3/uL (ref 142–424)
POC GRANULOCYTE: 7 — AB (ref 2–6.9)
POC LYMPH PERCENT: 23.5 %L (ref 10–50)
POC MID %: 5.9 %M (ref 0–12)
RBC: 4.57 M/uL — AB (ref 4.69–6.13)
RDW, POC: 14.3 %
WBC: 9.9 10*3/uL (ref 4.6–10.2)

## 2015-02-23 LAB — POCT GLYCOSYLATED HEMOGLOBIN (HGB A1C): Hemoglobin A1C: 5.7

## 2015-02-23 MED ORDER — PREDNISONE 20 MG PO TABS: ORAL_TABLET | ORAL | Status: AC

## 2015-02-23 MED ORDER — CYCLOBENZAPRINE HCL 10 MG PO TABS: 10.0000 mg | ORAL_TABLET | Freq: Three times a day (TID) | ORAL | Status: DC | PRN

## 2015-02-23 MED ORDER — HYDROCHLOROTHIAZIDE 12.5 MG PO CAPS: 12.5000 mg | ORAL_CAPSULE | Freq: Every day | ORAL | Status: AC

## 2015-02-23 MED ORDER — TRAMADOL-ACETAMINOPHEN 37.5-325 MG PO TABS: 1.0000 | ORAL_TABLET | Freq: Three times a day (TID) | ORAL | Status: DC | PRN

## 2015-02-23 MED ORDER — DULOXETINE HCL 20 MG PO CPEP: 20.0000 mg | ORAL_CAPSULE | Freq: Every day | ORAL | Status: DC

## 2015-02-23 NOTE — Patient Instructions (Signed)
Return to clinic in 3 months for a hypertension recheck.

## 2015-02-23 NOTE — Progress Notes (Signed)
02/24/2015 12:19 PM   DOB: [birth year] / MRN: [id number]  SUBJECTIVE:  Derek Howard is a 75 y.o.  male with a 40 pack year history of smoking presenting for left sided chest pressure that has been occuring for roughly 1 month.  He associates this pain with upper left sided thoracic back pain that is worse with movement. He denies SOB, DOE, diaphoresis, and nausea.  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  He denies weight loss, night sweats.    He complains of dysthymia that started roughly 3-4 years ago due to family issues and chronic pain.  PHQ9 shows moderately severe depression. He is interested in treatment for this.     Depression screen PHQ 2/9 02/23/2015  Decreased Interest 3  Down, Depressed, Hopeless 1  PHQ - 2 Score 4  Altered sleeping 3  Tired, decreased energy 1  Change in appetite 0  Feeling bad or failure about yourself  1  Trouble concentrating 3  Moving slowly or fidgety/restless 3  Suicidal thoughts 0  PHQ-9 Score 15  Difficult doing work/chores Very difficult     He has No Known Allergies.   He  has a past medical history of Cancer ([place]) and Hearing loss.    He  reports that he has never smoked. He does not have any smokeless tobacco history on file. He reports that he does not drink alcohol or use illicit drugs. He  has no sexual activity history on file. The patient  has no past surgical history on file.  His family history is not on file.  Review of Systems  Constitutional: Negative for fever and chills.  Eyes: Negative for blurred vision.  Respiratory: Negative for cough and shortness of breath.   Cardiovascular: Negative for chest pain.  Gastrointestinal: Negative for nausea and abdominal pain.  Genitourinary: Negative for dysuria, urgency and frequency.  Musculoskeletal: Negative for myalgias.  Skin: Negative for rash.  Neurological: Negative for dizziness, tingling and headaches.  Psychiatric/Behavioral:  Negative for depression. The patient is not nervous/anxious.     Problem list and medications reviewed and updated by myself where necessary, and exist elsewhere in the encounter.   OBJECTIVE:  BP 156/78 mmHg  Pulse 83  Temp(Src) 98.5 F (36.9 C) (Oral)  Resp 20  Ht '5\' 8"'$  (1.727 m)  Wt 168 lb 8 oz (76.431 kg)  BMI 25.63 kg/m2  SpO2 98%  Physical Exam  Constitutional: He is oriented to person, place, and time.  Cardiovascular: Normal rate and regular rhythm.   Pulmonary/Chest: Effort normal and breath sounds normal.  Abdominal: Soft. Bowel sounds are normal.  Musculoskeletal:       Left shoulder: Normal.       Arms: Neurological: He is alert and oriented to person, place, and time.  Vitals reviewed.   Results for orders placed or performed in visit on 02/23/15 (from the past 48 hour(s))  POCT glycosylated hemoglobin (Hb A1C)     Status: None   Collection Time: 02/23/15  8:04 PM  Result Value Ref Range   Hemoglobin A1C 5.7   POCT CBC     Status: Abnormal   Collection Time: 02/23/15  8:04 PM  Result Value Ref Range   WBC 9.9 4.6 - 10.2 K/uL   Lymph, poc 2.3 0.6 - 3.4   POC LYMPH PERCENT 23.5 10 - 50 %L   MID (cbc) 0.6 0 - 0.9   POC MID % 5.9 0 - 12 %  M   POC Granulocyte 7.0 (A) 2 - 6.9   Granulocyte percent 70.6 37 - 80 %G   RBC 4.57 (A) 4.69 - 6.13 M/uL   Hemoglobin 13.0 (A) 14.1 - 18.1 g/dL   HCT, POC 38.6 (A) 43.5 - 53.7 %   MCV 84.4 80 - 97 fL   MCH, POC 28.5 27 - 31.2 pg   MCHC 33.7 31.8 - 35.4 g/dL   RDW, POC 14.3 %   Platelet Count, POC 348 142 - 424 K/uL   MPV 6.9 0 - 99.8 fL  Troponin I     Status: None   Collection Time: 02/23/15  8:14 PM  Result Value Ref Range   Troponin I <0.01 <0.06 ng/mL    Comment: No indication of myocardial injury.   Vitals - 1 value per visit 02/23/2015 04/24/2014 10/29/2013 10/18/2013 2/83/6629  SYSTOLIC [phone]  DIASTOLIC 78 16 74 19 81      ASSESSMENT AND PLAN  Deontaye was seen today for back pain, hip pain and  depression.  Diagnoses and all orders for this visit:  Chest tightness or pressure: Troponin, EKG and chest rads negative for a cardiac etiology.  Most likely secondary to the lung mass.  He has distant 40 pack year history.  Per [facility] there is no significant weight loss and patient denies B symptoms.  Will evaluate with a chest CT and send him to Dr. Earlie Server.  Pending chest CT results will likely order a PET scan to expedite his care.   -     EKG 12-Lead -     DG Chest 2 View; Future -     Troponin I -     POCT glycosylated hemoglobin (Hb A1C) -     Cancel: CBC -     Basic metabolic panel -     Lipid panel -     POCT CBC -     Recheck vitals -     Ambulatory referral to Cardiology  Left-sided thoracic back pain -     predniSONE (DELTASONE) 20 MG tablet; Take 3 in the morning for 3 days, then 2 in the morning for 3 days, and then 1 in the morning for 3 days.  Muscle spasm -     cyclobenzaprine (FLEXERIL) 10 MG tablet; Take 1 tablet (10 mg total) by mouth 3 (three) times daily as needed for muscle spasms.  Essential hypertension -     hydrochlorothiazide (MICROZIDE) 12.5 MG capsule; Take 1 capsule (12.5 mg total) by mouth daily.  Lung mass -     CT Chest W Contrast; Future -     Ambulatory referral to Oncology  Dysthymia -     DULoxetine (CYMBALTA) 20 MG capsule; Take 1 capsule (20 mg total) by mouth daily.  Chronic low back pain -     traMADol-acetaminophen (ULTRACET) 37.5-325 MG tablet; Take 1 tablet by mouth every 8 (eight) hours as needed.    The patient was advised to call or return to clinic if he does not see an improvement in symptoms or to seek the care of the closest emergency department if he worsens with the above plan.   Philis Fendt, MHS, PA-C [facility] 02/24/2015 12:19 PM

## 2015-02-24 LAB — BASIC METABOLIC PANEL
BUN: 25 mg/dL (ref 7–25)
CO2: 26 mmol/L (ref 20–31)
Calcium: 9 mg/dL (ref 8.6–10.3)
Chloride: 107 mmol/L (ref 98–110)
Creat: 0.82 mg/dL (ref 0.70–1.18)
Glucose, Bld: 110 mg/dL — ABNORMAL HIGH (ref 65–99)
Potassium: 4.4 mmol/L (ref 3.5–5.3)
Sodium: 141 mmol/L (ref 135–146)

## 2015-02-24 LAB — LIPID PANEL
Cholesterol: 171 mg/dL (ref 125–200)
HDL: 39 mg/dL — ABNORMAL LOW (ref >=40)
LDL Cholesterol: 110 mg/dL (ref <130)
Total CHOL/HDL Ratio: 4.4 Ratio (ref <=5.0)
Triglycerides: 111 mg/dL (ref <150)
VLDL: 22 mg/dL (ref <30)

## 2015-02-24 LAB — TROPONIN I: Troponin I: <0.01 ng/mL (ref <0.06)

## 2015-02-24 NOTE — Progress Notes (Signed)
Patient seen with Philis Fendt, PA-C. Mr. Carlis Abbott had evaluated the patient. X-ray was reviewed and the patient has a right upper lobe mass. Long discussion was held with the patient and his wife, explaining that there was some kind of a tubular lesion in the chest that needed further evaluation. They had a hard time comprehending that this was of significant concern, and could turn out to be a malignancy. Explained to them that referral would be needed to specialist as well as getting a CT scan. Comprehension seemed poor still at time of release, and they still were thinking that it might be something that could just go away. CT and referrals will be made.

## 2015-02-26 ENCOUNTER — Telehealth: Payer: Self-pay | Admitting: Internal Medicine

## 2015-02-26 ENCOUNTER — Encounter: Payer: Self-pay | Admitting: *Deleted

## 2015-02-26 NOTE — Progress Notes (Signed)
Oncology Nurse Navigator Documentation  Oncology Nurse Navigator Flowsheets 02/26/2015  Navigator Location [facility]/I receive referral today on Mr. Cypert.  Asked Kim to schedule with Dr. Julien Nordmann  Navigator Encounter Type Other  Interventions Coordination of Care  Coordination of Care Appts  Time Spent with Patient 15

## 2015-02-26 NOTE — Telephone Encounter (Signed)
S/W PT'S WIFE IN REF TO NP APPT ON 03/10/15'@1'$ :45 REFERRING DR Pollyann Glen [facility]

## 2015-03-05 ENCOUNTER — Telehealth: Payer: Self-pay | Admitting: Physician Assistant

## 2015-03-05 ENCOUNTER — Ambulatory Visit
Admission: RE | Admit: 2015-03-05 | Discharge: 2015-03-05 | Disposition: A | Discharge status: Home / Self Care | Payer: Commercial Managed Care - HMO | Source: Ambulatory Visit | Attending: Physician Assistant | Admitting: Physician Assistant

## 2015-03-05 DIAGNOSIS — R918 Other nonspecific abnormal finding of lung field: Secondary | ICD-10-CM

## 2015-03-05 DIAGNOSIS — R222 Localized swelling, mass and lump, trunk: Secondary | ICD-10-CM | POA: Diagnosis not present

## 2015-03-05 MED ORDER — IOPAMIDOL (ISOVUE-300) INJECTION 61%
75.0000 mL | Freq: Once | INTRAVENOUS | Status: AC | PRN
  Administered 2015-03-05: 75 mL via INTRAVENOUS

## 2015-03-05 NOTE — Telephone Encounter (Signed)
New Message  This message is to inform you that we have made 3 consecutive attempts to contact the patient.  We have also mailed a letter to the patient to inform them to call in and schedule. Although we were unsuccessful in these attempts we wanted you to be aware of our efforts. Will remove the patient from our referral Work Queue at this time.    [facility]

## 2015-03-06 ENCOUNTER — Other Ambulatory Visit: Payer: Self-pay | Admitting: Physician Assistant

## 2015-03-06 DIAGNOSIS — R918 Other nonspecific abnormal finding of lung field: Secondary | ICD-10-CM

## 2015-03-06 DIAGNOSIS — K769 Liver disease, unspecified: Secondary | ICD-10-CM

## 2015-03-10 ENCOUNTER — Other Ambulatory Visit: Payer: Self-pay | Admitting: Medical Oncology

## 2015-03-10 ENCOUNTER — Other Ambulatory Visit (HOSPITAL_BASED_OUTPATIENT_CLINIC_OR_DEPARTMENT_OTHER): Payer: Commercial Managed Care - HMO

## 2015-03-10 ENCOUNTER — Encounter: Payer: Self-pay | Admitting: Internal Medicine

## 2015-03-10 ENCOUNTER — Ambulatory Visit (HOSPITAL_BASED_OUTPATIENT_CLINIC_OR_DEPARTMENT_OTHER): Payer: Commercial Managed Care - HMO | Admitting: Internal Medicine

## 2015-03-10 ENCOUNTER — Telehealth: Payer: Self-pay | Admitting: Internal Medicine

## 2015-03-10 VITALS — BP 150/74 | HR 104 | Temp 99.9°F | Resp 23 | Ht 67.0 in | Wt 163.4 lb

## 2015-03-10 DIAGNOSIS — R918 Other nonspecific abnormal finding of lung field: Secondary | ICD-10-CM

## 2015-03-10 DIAGNOSIS — G8929 Other chronic pain: Secondary | ICD-10-CM

## 2015-03-10 DIAGNOSIS — M545 Low back pain: Secondary | ICD-10-CM

## 2015-03-10 DIAGNOSIS — R252 Cramp and spasm: Secondary | ICD-10-CM | POA: Diagnosis not present

## 2015-03-10 HISTORY — DX: Other nonspecific abnormal finding of lung field: R91.8

## 2015-03-10 LAB — COMPREHENSIVE METABOLIC PANEL
ALK PHOS: 62 U/L (ref 40–150)
ALT: 35 U/L (ref 0–55)
AST: 20 U/L (ref 5–34)
Albumin: 3.6 g/dL (ref 3.5–5.0)
Anion Gap: 9 mEq/L (ref 3–11)
BUN: 25.1 mg/dL (ref 7.0–26.0)
CHLORIDE: 106 meq/L (ref 98–109)
CO2: 26 meq/L (ref 22–29)
Calcium: 9.3 mg/dL (ref 8.4–10.4)
Creatinine: 0.9 mg/dL (ref 0.7–1.3)
EGFR: 82 mL/min/{1.73_m2} — AB (ref >90)
GLUCOSE: 119 mg/dL (ref 70–140)
POTASSIUM: 4.2 meq/L (ref 3.5–5.1)
SODIUM: 141 meq/L (ref 136–145)
Total Bilirubin: <0.3 mg/dL (ref 0.20–1.20)
Total Protein: 7.4 g/dL (ref 6.4–8.3)

## 2015-03-10 LAB — CBC WITH DIFFERENTIAL/PLATELET
BASO%: 0.2 % (ref 0.0–2.0)
BASOS ABS: 0 10*3/uL (ref 0.0–0.1)
EOS ABS: 0 10*3/uL (ref 0.0–0.5)
EOS%: 0.1 % (ref 0.0–7.0)
HCT: 42 % (ref 38.4–49.9)
HEMOGLOBIN: 13.6 g/dL (ref 13.0–17.1)
LYMPH%: 5.1 % — AB (ref 14.0–49.0)
MCH: 27.5 pg (ref 27.2–33.4)
MCHC: 32.3 g/dL (ref 32.0–36.0)
MCV: 85.2 fL (ref 79.3–98.0)
MONO#: 0.4 10*3/uL (ref 0.1–0.9)
MONO%: 2.3 % (ref 0.0–14.0)
NEUT#: 16.4 10*3/uL — ABNORMAL HIGH (ref 1.5–6.5)
NEUT%: 92.3 % — AB (ref 39.0–75.0)
Platelets: 404 10*3/uL — ABNORMAL HIGH (ref 140–400)
RBC: 4.93 10*6/uL (ref 4.20–5.82)
RDW: 13.8 % (ref 11.0–14.6)
WBC: 17.7 10*3/uL — ABNORMAL HIGH (ref 4.0–10.3)
lymph#: 0.9 10*3/uL (ref 0.9–3.3)

## 2015-03-10 NOTE — Telephone Encounter (Signed)
per pof to sch pt appt-gave pt copy of avs-adv [facility] will call to sch scan

## 2015-03-10 NOTE — Progress Notes (Signed)
[facility] Telephone:([phone]   Fax:([phone]) [phone]  CONSULT NOTE  REFERRING PHYSICIAN: Dr. Ruben Reason  REASON FOR CONSULTATION:  75 years old white male with questionable lung cancer.  HPI Derek Howard is a 75 y.o. 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 The patient also has chronic back pain and long history of smoking but quit 10 years ago. Has been complaining of spasms on the left side of his upper back. He presented to the urgent care center for evaluation and chest x-ray was performed on 02/23/2015. It showed 3.5 cm right upper lobe lung mass suspicious for primary bronchogenic carcinoma. This was followed by CT scan of the chest with contrast on 03/05/2015 and it showed a spiculated 3.7 x 3.0 cm lung mass in the central right upper lobe. There are a subpleural 4 mm anterior right middle lobe pulmonary nodule associated with the minor fissure. There are mildly to moderately enlarged right paratracheal lymph nodes the largest measured 1.7 cm. There is no hilar or contralateral mediastinal lymphadenopathy. There are numerous subcentimeter hypodense liver lesions are scattered throughout the visualized liver too small to characterize. There was also left adrenal 1.8 cm mass with indeterminate density. The patient was referred to me today for further evaluation and recommendation regarding these findings. When seen today he continues to have the spasm on the left side of his upper back as well as shortness breath with exertion and mild cough with no hemoptysis. He lost around 6 pounds in the last few weeks. He denied having any significant nausea or vomiting, no fever or chills. He denied having any headache but has blurry vision. Family history significant for mother who died at age 33 and father died at age 48 from old age. The  patient is married and has 2 sons. He was accompanied by his wife Derek Howard and a family friend. He is currently retired and used to work in a warehouse loading trucks. He has a history of smoking 2 pack per day for around 50 years and quit 10 years ago. He also has a history of alcohol abuse and quit several years ago. He has no history of drug abuse.  HPI  Past Medical History  Diagnosis Date  . Cancer ([place])     basal cell carcinoma  . Hearing loss   . Lung mass 03/10/2015    History reviewed. No pertinent past surgical history.  History reviewed. No pertinent family history.  Social History Social History  Substance Use Topics  . Smoking status: Never Smoker   . Smokeless tobacco: None  . Alcohol Use: No    No Known Allergies  Current Outpatient Prescriptions  Medication Sig Dispense Refill  . cyclobenzaprine (FLEXERIL) 10 MG tablet Take 1 tablet (10 mg total) by mouth 3 (three) times daily as needed for muscle spasms. 30 tablet 0  . DULoxetine (CYMBALTA) 20 MG capsule Take 1 capsule (20 mg total) by mouth daily. 30 capsule 4  . hydrochlorothiazide (MICROZIDE) 12.5 MG capsule Take 1 capsule (12.5 mg total) by mouth daily. 30 capsule 4  . methylPREDNISolone (MEDROL DOSEPAK) 4 MG TBPK tablet Take 20 mg by mouth. Take as directed    . traMADol-acetaminophen (ULTRACET) 37.5-325 MG tablet Take 1 tablet by mouth every 8 (eight) hours as needed. 90 tablet 0   No current facility-administered medications for this visit.  Review of Systems  Constitutional: positive for anorexia, fatigue and weight loss Eyes: negative Ears, nose, mouth, throat, and face: negative Respiratory: positive for cough and dyspnea on exertion Cardiovascular: negative Gastrointestinal: negative Genitourinary:negative Integument/breast: negative Hematologic/lymphatic: negative Musculoskeletal:negative Neurological: negative Behavioral/Psych: negative Endocrine: negative Allergic/Immunologic:  negative  Physical Exam  ZOX:WRUEA, healthy, no distress, ill looking and malnourished SKIN: skin color, texture, turgor are normal, no rashes or significant lesions HEAD: Normocephalic, No masses, lesions, tenderness or abnormalities EYES: normal, PERRLA, Conjunctiva are pink and non-injected EARS: External ears normal, Canals clear OROPHARYNX:no exudate, no erythema and lips, buccal mucosa, and tongue normal  NECK: supple, no adenopathy, no JVD LYMPH:  no palpable lymphadenopathy, no hepatosplenomegaly LUNGS: expiratory wheezes bilaterally HEART: regular rate & rhythm, no murmurs and no gallops ABDOMEN:abdomen soft, non-tender, normal bowel sounds and no masses or organomegaly BACK: Back symmetric, no curvature., No CVA tenderness EXTREMITIES:no joint deformities, effusion, or inflammation, no edema, no skin discoloration  NEURO: alert & oriented x 3 with fluent speech, no focal motor/sensory deficits  PERFORMANCE STATUS: ECOG 1  LABORATORY DATA: Lab Results  Component Value Date   WBC 17.7* 03/10/2015   HGB 13.6 03/10/2015   HCT 42.0 03/10/2015   MCV 85.2 03/10/2015   PLT 404* 03/10/2015      Chemistry      Component Value Date/Time   NA 141 03/10/2015 1411   NA 141 02/23/2015 2004   K 4.2 03/10/2015 1411   K 4.4 02/23/2015 2004   CL 107 02/23/2015 2004   CO2 26 03/10/2015 1411   CO2 26 02/23/2015 2004   BUN 25.1 03/10/2015 1411   BUN 25 02/23/2015 2004   CREATININE 0.9 03/10/2015 1411   CREATININE 0.82 02/23/2015 2004   CREATININE 0.86 05/29/2012 1808      Component Value Date/Time   CALCIUM 9.3 03/10/2015 1411   CALCIUM 9.0 02/23/2015 2004   ALKPHOS 62 03/10/2015 1411   ALKPHOS 49 05/29/2012 1808   AST 20 03/10/2015 1411   AST 20 05/29/2012 1808   ALT 35 03/10/2015 1411   ALT 25 05/29/2012 1808   BILITOT <0.30 03/10/2015 1411   BILITOT 0.3 05/29/2012 1808       RADIOGRAPHIC STUDIES: Dg Chest 2 View  02/23/2015  CLINICAL DATA:  Chest tightness.   History of "Cancer" . EXAM: CHEST  2 VIEW COMPARISON:  None. FINDINGS: Lateral view degraded by patient arm position. Moderate thoracic spondylosis. Midline trachea. Normal heart size. Atherosclerosis in the transverse aorta. Moderate hiatal hernia. No pleural effusion or pneumothorax. Hyperinflation. Medial right upper lobe lung mass measures 3.5 x 2.7 cm and likely projects over the aorta on the lateral view. Diffuse peribronchial thickening. No lobar consolidation IMPRESSION: 3.5 cm right upper lobe lung mass, suspicious for primary bronchogenic carcinoma. Hiatal hernia. Aortic atherosclerosis. These results will be called to the ordering clinician or representative by the Radiologist Assistant, and communication documented in the PACS or zVision Dashboard. Electronically Signed   By: Abigail Miyamoto M.D.   On: 02/23/2015 20:31   Ct Chest W Contrast  03/05/2015  CLINICAL DATA:  Right upper lung mass on recent chest radiograph. EXAM: CT CHEST WITH CONTRAST TECHNIQUE: Multidetector CT imaging of the chest was performed during intravenous contrast administration. CONTRAST:  63m ISOVUE-300 IOPAMIDOL (ISOVUE-300) INJECTION 61% COMPARISON:  02/23/2015 chest radiograph. FINDINGS: Mediastinum/Nodes: Normal heart size. No pericardial fluid/thickening. Left circumflex and right coronary atherosclerosis. Great vessels are normal in course and caliber. No central pulmonary emboli. Normal visualized thyroid. Fluid throughout the thoracic  esophagus, suggesting esophageal dysmotility and/or gastroesophageal reflux. No pathologically enlarged axillary lymph nodes. There are mildly to moderately enlarged right paratracheal lymph nodes, largest 1.7 cm (series 3/ image 22). No hilar or contralateral mediastinal lymphadenopathy. Lungs/Pleura: No pneumothorax. No pleural effusion. Mild centrilobular emphysema. Spiculated 3.7 x 3.0 cm lung mass in the central right upper lobe (series 4/ image 21). Subpleural 4 mm anterior right  middle lobe pulmonary nodule associated with the minor fissure (series 4/ image 40). No acute consolidative airspace disease or additional significant pulmonary nodules. Small parenchymal bands in the anterior lingula and anterior left lower lobe, favor mild postinfectious/ postinflammatory scarring. Upper abdomen: Simple 2.3 cm liver cyst in the posterior lateral segment left liver lobe. There are numerous (greater than 10) additional subcentimeter hypodense liver lesions scattered throughout the visualized liver, too small to characterize. Left adrenal 1.8 cm mass (series 3/ image 61) with indeterminate density of 23 HU. Moderate hiatal hernia. Otherwise grossly normal visualized stomach . Musculoskeletal: No aggressive appearing focal osseous lesions. Moderate degenerative changes in the thoracic spine. IMPRESSION: 1. Spiculated 3.7 cm central right upper lobe lung mass, most in keeping with a primary bronchogenic carcinoma. 2. Ipsilateral mediastinal lymphadenopathy, probably metastatic. 3. Solitary 4 mm right middle lobe pulmonary nodule, cannot exclude ipsilateral lung metastasis. 4. Indeterminate 1.8 cm left adrenal mass. 5. Recommend PET-CT for further staging evaluation of the above findings. Thoracic surgery and oncology consultation is warranted. 6. Innumerable subcentimeter hypodense liver lesions, too small to characterize. Consider further evaluation with MRI abdomen with and without intravenous contrast. 7. Additional findings including coronary atherosclerosis, mild centrilobular emphysema and moderate hiatal hernia. Electronically Signed   By: Ilona Sorrel M.D.   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   PLAN: I had a lengthy  discussion with the patient and his family today about his current condition and further investigation to confirm a diagnosis as well as completing the staging workup. I discussed the scan results and showed the images to the patient and his family. I will arrange for the patient to have a PET scan as well as MRI of the brain to complete the staging workup. I will also refer the patient to thoracic surgery for consideration of bronchoscopy with endobronchial ultrasound and biopsies to confirm the tissue diagnosis. I will see the patient back for follow-up visit in 2 weeks for evaluation and discussion of his treatment options based on the final pathology and staging workup. For the muscle spasm he is currently on Medrol Dosepak, tramadol and Flexeril. He was advised to call immediately if he has any concerning symptoms in the interval. The patient voices understanding of current disease status and treatment options and is in agreement with the current care plan.  All questions were answered. The patient knows to call the clinic with any problems, questions or concerns. We can certainly see the patient much sooner if necessary.  Thank you so much for allowing me to participate in the care of Derek Howard. I will continue to follow up the patient with you and assist in his care.  I spent 40 minutes counseling the patient face to face. The total time spent in the appointment was 60 minutes.  Disclaimer: This note was dictated with voice recognition  software. Similar sounding words can inadvertently be transcribed and may not be corrected upon review.   Marzelle Rutten K. March 10, 2015, 5:14 PM

## 2015-03-10 NOTE — Telephone Encounter (Signed)
per pof to sch pt appt-gave pt copy of avs-[facility] will call to sch scan

## 2015-03-16 ENCOUNTER — Encounter: Payer: Commercial Managed Care - HMO | Admitting: Thoracic Surgery (Cardiothoracic Vascular Surgery)

## 2015-03-19 ENCOUNTER — Encounter: Payer: Commercial Managed Care - HMO | Admitting: Thoracic Surgery (Cardiothoracic Vascular Surgery)

## 2015-03-19 ENCOUNTER — Telehealth: Payer: Self-pay | Admitting: Internal Medicine

## 2015-03-19 NOTE — Telephone Encounter (Signed)
Returned call and spoke to patient and confirmed his appointments set for 1/31.

## 2015-03-20 ENCOUNTER — Encounter: Payer: Commercial Managed Care - HMO | Admitting: Thoracic Surgery (Cardiothoracic Vascular Surgery)

## 2015-03-24 ENCOUNTER — Telehealth: Payer: Self-pay | Admitting: Internal Medicine

## 2015-03-24 ENCOUNTER — Institutional Professional Consult (permissible substitution) (INDEPENDENT_AMBULATORY_CARE_PROVIDER_SITE_OTHER): Payer: Commercial Managed Care - HMO | Admitting: Thoracic Surgery (Cardiothoracic Vascular Surgery)

## 2015-03-24 ENCOUNTER — Encounter: Payer: Self-pay | Admitting: Internal Medicine

## 2015-03-24 ENCOUNTER — Ambulatory Visit (HOSPITAL_BASED_OUTPATIENT_CLINIC_OR_DEPARTMENT_OTHER): Payer: Commercial Managed Care - HMO | Admitting: Internal Medicine

## 2015-03-24 ENCOUNTER — Encounter: Payer: Self-pay | Admitting: Thoracic Surgery (Cardiothoracic Vascular Surgery)

## 2015-03-24 VITALS — BP 141/67 | HR 110 | Temp 98.5°F | Resp 19 | Ht 67.0 in | Wt 163.8 lb

## 2015-03-24 VITALS — BP 148/87 | HR 110 | Resp 20 | Ht 67.0 in | Wt 163.0 lb

## 2015-03-24 DIAGNOSIS — R918 Other nonspecific abnormal finding of lung field: Secondary | ICD-10-CM

## 2015-03-24 NOTE — Progress Notes (Signed)
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  PRIOR THERAPY: None  CURRENT THERAPY: None.  INTERVAL HISTORY: Derek Howard 75 y.o. male returns to the clinic today for follow-up visit. The patient was supposed to have a PET scan and MRI of the brain performed last week but he did not show up for his appointment. He is here today for evaluation. He is scheduled to see Dr. Roxan Hockey for surgical evaluation later this afternoon but he is not also familiar with the appointment. He is feeling fine today was no specific complaints. He denied having any significant chest pain but continues to have shortness breath with exertion. He has no significant weight loss.  MEDICAL HISTORY: Past Medical History  Diagnosis Date  . Cancer ([place])     basal cell carcinoma  . Hearing loss   . Lung mass 03/10/2015    ALLERGIES:  has No Known Allergies.  MEDICATIONS:  Current Outpatient Prescriptions  Medication Sig Dispense Refill  . cyclobenzaprine (FLEXERIL) 10 MG tablet Take 1 tablet (10 mg total) by mouth 3 (three) times daily as needed for muscle spasms. 30 tablet 0  . DULoxetine (CYMBALTA) 20 MG capsule Take 1 capsule (20 mg total) by mouth daily. 30 capsule 4  . hydrochlorothiazide (MICROZIDE) 12.5 MG capsule Take 1 capsule (12.5 mg total) by mouth daily. 30 capsule 4  . methylPREDNISolone (MEDROL DOSEPAK) 4 MG TBPK tablet Take 20 mg by mouth. Take as directed    . traMADol-acetaminophen (ULTRACET) 37.5-325 MG tablet Take 1 tablet by mouth every 8 (eight) hours as needed. 90 tablet 0   No current facility-administered medications for this visit.     SURGICAL HISTORY: History reviewed. No pertinent past surgical history.  REVIEW OF SYSTEMS:  A comprehensive review of systems was negative except for: Respiratory: positive for dyspnea on exertion   PHYSICAL EXAMINATION: General appearance: alert, cooperative, fatigued and no distress Head: Normocephalic, without obvious abnormality, atraumatic Neck: no adenopathy, no JVD, supple, symmetrical, trachea midline and thyroid not enlarged, symmetric, no tenderness/mass/nodules Lymph nodes: Cervical, supraclavicular, and axillary nodes normal. Resp: clear to auscultation bilaterally Back: symmetric, no curvature. ROM normal. No CVA tenderness. Cardio: regular rate and rhythm, S1, S2 normal, no murmur, click, rub or gallop GI: soft, non-tender; bowel sounds normal; no masses,  no organomegaly Extremities: extremities normal, atraumatic, no cyanosis or edema  ECOG PERFORMANCE STATUS: 1 - Symptomatic but completely ambulatory  Blood pressure 141/67, pulse 110, temperature 98.5 F (36.9 C), temperature source Oral, resp. rate 19, height '5\' 7"'$  (1.702 m), weight 163 lb 12.8 oz (74.299 kg), SpO2 97 %.  LABORATORY DATA: Lab Results  Component Value Date   WBC 17.7* 03/10/2015   HGB 13.6 03/10/2015   HCT 42.0 03/10/2015   MCV 85.2 03/10/2015   PLT 404* 03/10/2015      Chemistry      Component Value Date/Time   NA 141 03/10/2015 1411   NA 141 02/23/2015 2004   K 4.2 03/10/2015 1411   K 4.4 02/23/2015 2004   CL 107 02/23/2015 2004   CO2 26 03/10/2015 1411   CO2 26 02/23/2015 2004   BUN 25.1 03/10/2015 1411   BUN 25 02/23/2015 2004  CREATININE 0.9 03/10/2015 1411   CREATININE 0.82 02/23/2015 2004   CREATININE 0.86 05/29/2012 1808      Component Value Date/Time   CALCIUM 9.3 03/10/2015 1411   CALCIUM 9.0 02/23/2015 2004   ALKPHOS 62 03/10/2015 1411   ALKPHOS 49 05/29/2012 1808   AST 20 03/10/2015 1411   AST 20 05/29/2012 1808   ALT 35 03/10/2015 1411   ALT 25 05/29/2012 1808    BILITOT <0.30 03/10/2015 1411   BILITOT 0.3 05/29/2012 1808       RADIOGRAPHIC STUDIES: Dg Chest 2 View  02/23/2015  CLINICAL DATA:  Chest tightness.  History of "Cancer" . EXAM: CHEST  2 VIEW COMPARISON:  None. FINDINGS: Lateral view degraded by patient arm position. Moderate thoracic spondylosis. Midline trachea. Normal heart size. Atherosclerosis in the transverse aorta. Moderate hiatal hernia. No pleural effusion or pneumothorax. Hyperinflation. Medial right upper lobe lung mass measures 3.5 x 2.7 cm and likely projects over the aorta on the lateral view. Diffuse peribronchial thickening. No lobar consolidation IMPRESSION: 3.5 cm right upper lobe lung mass, suspicious for primary bronchogenic carcinoma. Hiatal hernia. Aortic atherosclerosis. These results will be called to the ordering clinician or representative by the Radiologist Assistant, and communication documented in the PACS or zVision Dashboard. Electronically Signed   By: Abigail Miyamoto M.D.   On: 02/23/2015 20:31   Ct Chest W Contrast  03/05/2015  CLINICAL DATA:  Right upper lung mass on recent chest radiograph. EXAM: CT CHEST WITH CONTRAST TECHNIQUE: Multidetector CT imaging of the chest was performed during intravenous contrast administration. CONTRAST:  37m ISOVUE-300 IOPAMIDOL (ISOVUE-300) INJECTION 61% COMPARISON:  02/23/2015 chest radiograph. FINDINGS: Mediastinum/Nodes: Normal heart size. No pericardial fluid/thickening. Left circumflex and right coronary atherosclerosis. Great vessels are normal in course and caliber. No central pulmonary emboli. Normal visualized thyroid. Fluid throughout the thoracic esophagus, suggesting esophageal dysmotility and/or gastroesophageal reflux. No pathologically enlarged axillary lymph nodes. There are mildly to moderately enlarged right paratracheal lymph nodes, largest 1.7 cm (series 3/ image 22). No hilar or contralateral mediastinal lymphadenopathy. Lungs/Pleura: No pneumothorax. No pleural  effusion. Mild centrilobular emphysema. Spiculated 3.7 x 3.0 cm lung mass in the central right upper lobe (series 4/ image 21). Subpleural 4 mm anterior right middle lobe pulmonary nodule associated with the minor fissure (series 4/ image 40). No acute consolidative airspace disease or additional significant pulmonary nodules. Small parenchymal bands in the anterior lingula and anterior left lower lobe, favor mild postinfectious/ postinflammatory scarring. Upper abdomen: Simple 2.3 cm liver cyst in the posterior lateral segment left liver lobe. There are numerous (greater than 10) additional subcentimeter hypodense liver lesions scattered throughout the visualized liver, too small to characterize. Left adrenal 1.8 cm mass (series 3/ image 61) with indeterminate density of 23 HU. Moderate hiatal hernia. Otherwise grossly normal visualized stomach . Musculoskeletal: No aggressive appearing focal osseous lesions. Moderate degenerative changes in the thoracic spine. IMPRESSION: 1. Spiculated 3.7 cm central right upper lobe lung mass, most in keeping with a primary bronchogenic carcinoma. 2. Ipsilateral mediastinal lymphadenopathy, probably metastatic. 3. Solitary 4 mm right middle lobe pulmonary nodule, cannot exclude ipsilateral lung metastasis. 4. Indeterminate 1.8 cm left adrenal mass. 5. Recommend PET-CT for further staging evaluation of the above findings. Thoracic surgery and oncology consultation is warranted. 6. Innumerable subcentimeter hypodense liver lesions, too small to characterize. Consider further evaluation with MRI abdomen with and without intravenous contrast. 7. Additional findings including coronary atherosclerosis, mild centrilobular emphysema and moderate hiatal hernia. Electronically Signed   By: JRinaldo Ratel  Poff M.D.   On: 03/05/2015 17:04    ASSESSMENT AND PLAN: This is a very pleasant 75 years old white male was questionably stage IIIa lung cancer, pending tissue diagnosis. The patient missed  his appointment for the staging workup including a PET scan and MRI of the brain. I will schedule these tests to be performed within one week. I also reminded the patient of his appointment with the thoracic surgeon later today. I will see him back for follow-up visit in 3 weeks for reevaluation after his evaluation by surgery and also completion of the staging workup. He was advised to call if he has any concerning symptoms in the interval. The patient voices understanding of current disease status and treatment options and is in agreement with the current care plan.  All questions were answered. The patient knows to call the clinic with any problems, questions or concerns. We can certainly see the patient much sooner if necessary.  Disclaimer: This note was dictated with voice recognition software. Similar sounding words can inadvertently be transcribed and may not be corrected upon review.

## 2015-03-24 NOTE — Telephone Encounter (Signed)
Pt confirmed MD visit per 01/31 POF, gave pt AVS and Calendar.Cherylann Banas, spk w/Susan with [facility] sent me to [facility] [phone] to schedule MRI and PET scan, CS has been leaving msg and mailed out schedule. Advised pt this is very important that he gets back with them to get these scheduled. Advised pt left msg with Tonya to call pt to schedule both apts.

## 2015-03-24 NOTE — Progress Notes (Signed)
PCP is [name], MD Referring Provider is Curt Bears, MD  Chief Complaint  Patient presents with  . Lung Mass    Surgical eval, Chest CT 03/05/15, PET Scan and MRI Brain Pending    HPI: 75 year old man for consultation regarding a right lung mass with mediastinal adenopathy.  Derek Howard is a 75 year old man with a long history of tobacco abuse (2 packs per day for 50 years prior to quitting 10 years ago), hearing loss, history of ethanol abuse (quit in 2013), and basal cell carcinoma. He was evaluated for back and chest pain at an urgent care in early January. His EKG showed no acute changes in his troponins were negative. Chest x-ray showed a right lung mass. A CT of the chest was done on 03/05/2015 which showed a 3.0 x 3.7 cm spiculated mass in the right upper lobe with mediastinal adenopathy. Was also noted to have coronary atherosclerosis, aortic atherosclerosis, a fluid-filled esophagus and hiatal hernia. There were some questionable lesions in the liver that were too small to characterize.  He quit smoking about 10 years ago. He is a very poor historian and extremely hard of hearing. He says that he has not had any further chest pain since the episode in early January. He attributed that to being on Flexeril. He has lost 5 pounds over the past 3 months, but says his appetite is good. He denies any significant cough or hemoptysis. He denies wheezing or shortness of breath.   Zubrod Score: At the time of surgery this patient's most appropriate activity status/level should be described as: '[]'$     0    Normal activity, no symptoms '[x]'$     1    Restricted in physical strenuous activity but ambulatory, able to do out light work '[]'$     2    Ambulatory and capable of self care, unable to do work activities, up and about >50 % of waking hours                              '[]'$     3    Only limited self care, in bed greater than 50% of waking hours '[]'$     4    Completely disabled, no self  care, confined to bed or chair '[]'$     5    Moribund    Past Medical History  Diagnosis Date  . Cancer ([place])     basal cell carcinoma  . Hearing loss   . Lung mass 03/10/2015  . Alcohol abuse     stopped 2013    No past surgical history on file.  No family history on file.  Social History Social History  Substance Use Topics  . Smoking status: Former Smoker -- 2.00 packs/day for 50 years    Types: Cigarettes    Quit date: 03/23/2005  . Smokeless tobacco: Never Used  . Alcohol Use: No     Comment: stopped drinking 2013    Current Outpatient Prescriptions  Medication Sig Dispense Refill  . cyclobenzaprine (FLEXERIL) 10 MG tablet Take 1 tablet (10 mg total) by mouth 3 (three) times daily as needed for muscle spasms. 30 tablet 0  . DULoxetine (CYMBALTA) 20 MG capsule Take 1 capsule (20 mg total) by mouth daily. 30 capsule 4  . hydrochlorothiazide (MICROZIDE) 12.5 MG capsule Take 1 capsule (12.5 mg total) by mouth daily. 30 capsule 4  . methylPREDNISolone (MEDROL DOSEPAK) 4 MG TBPK tablet  Take 20 mg by mouth. Take as directed    . traMADol-acetaminophen (ULTRACET) 37.5-325 MG tablet Take 1 tablet by mouth every 8 (eight) hours as needed. 90 tablet 0   No current facility-administered medications for this visit.    No Known Allergies  Review of Systems  Constitutional: Positive for unexpected weight change (lost 5 pounds in 3 months). Negative for activity change and appetite change.  HENT: Positive for hearing loss. Negative for trouble swallowing and voice change.   Eyes: Negative for visual disturbance.  Respiratory: Positive for cough. Negative for shortness of breath and wheezing.   Cardiovascular: Positive for chest pain (chest and back pain- ECG and troponin showed no evidence of cardiac source).  Gastrointestinal: Negative for abdominal pain, blood in stool and abdominal distention.  Genitourinary: Negative for hematuria and difficulty urinating.  Musculoskeletal:  Positive for myalgias, back pain, joint swelling and arthralgias.  Neurological: Negative for dizziness, weakness and headaches.  Hematological: Negative for adenopathy. Does not bruise/bleed easily.  All other systems reviewed and are negative.   BP 148/87 mmHg  Pulse 110  Resp 20  Ht '5\' 7"'$  (1.702 m)  Wt 163 lb (73.936 kg)  BMI 25.52 kg/m2  SpO2 93% Physical Exam  Constitutional: He is oriented to person, place, and time.  Disheveled appearance  HENT:  Head: Normocephalic and atraumatic.  Mouth/Throat: No oropharyngeal exudate.  Eyes: Conjunctivae and EOM are normal. Pupils are equal, round, and reactive to light. No scleral icterus.  Neck: Neck supple. No tracheal deviation present. No thyromegaly present.  Cardiovascular: Normal heart sounds.  Exam reveals no gallop and no friction rub.   No murmur heard. Tachy, regular  Pulmonary/Chest: He has no wheezes. He has no rales.  Poor compliance with request for deep breath  Abdominal: Soft. He exhibits no distension. There is no tenderness.  Musculoskeletal: He exhibits no edema.  Lymphadenopathy:    He has no cervical adenopathy.  Neurological: He is alert and oriented to person, place, and time. He exhibits normal muscle tone.  Very hard of hearing. No focal motor deficit  Skin: Skin is warm and dry.  Vitals reviewed.    Diagnostic Tests: CT CHEST WITH CONTRAST  TECHNIQUE: Multidetector CT imaging of the chest was performed during intravenous contrast administration.  CONTRAST: 15m ISOVUE-300 IOPAMIDOL (ISOVUE-300) INJECTION 61%  COMPARISON: 02/23/2015 chest radiograph.  FINDINGS: Mediastinum/Nodes: Normal heart size. No pericardial fluid/thickening. Left circumflex and right coronary atherosclerosis. Great vessels are normal in course and caliber. No central pulmonary emboli. Normal visualized thyroid. Fluid throughout the thoracic esophagus, suggesting esophageal dysmotility and/or gastroesophageal  reflux. No pathologically enlarged axillary lymph nodes. There are mildly to moderately enlarged right paratracheal lymph nodes, largest 1.7 cm (series 3/ image 22). No hilar or contralateral mediastinal lymphadenopathy.  Lungs/Pleura: No pneumothorax. No pleural effusion. Mild centrilobular emphysema. Spiculated 3.7 x 3.0 cm lung mass in the central right upper lobe (series 4/ image 21). Subpleural 4 mm anterior right middle lobe pulmonary nodule associated with the minor fissure (series 4/ image 40). No acute consolidative airspace disease or additional significant pulmonary nodules. Small parenchymal bands in the anterior lingula and anterior left lower lobe, favor mild postinfectious/ postinflammatory scarring.  Upper abdomen: Simple 2.3 cm liver cyst in the posterior lateral segment left liver lobe. There are numerous (greater than 10) additional subcentimeter hypodense liver lesions scattered throughout the visualized liver, too small to characterize. Left adrenal 1.8 cm mass (series 3/ image 61) with indeterminate density of 23 HU. Moderate hiatal hernia.  Otherwise grossly normal visualized stomach .  Musculoskeletal: No aggressive appearing focal osseous lesions. Moderate degenerative changes in the thoracic spine.  IMPRESSION: 1. Spiculated 3.7 cm central right upper lobe lung mass, most in keeping with a primary bronchogenic carcinoma. 2. Ipsilateral mediastinal lymphadenopathy, probably metastatic. 3. Solitary 4 mm right middle lobe pulmonary nodule, cannot exclude ipsilateral lung metastasis. 4. Indeterminate 1.8 cm left adrenal mass. 5. Recommend PET-CT for further staging evaluation of the above findings. Thoracic surgery and oncology consultation is warranted. 6. Innumerable subcentimeter hypodense liver lesions, too small to characterize. Consider further evaluation with MRI abdomen with and without intravenous contrast. 7. Additional findings including  coronary atherosclerosis, mild centrilobular emphysema and moderate hiatal hernia.   Electronically Signed  By: Ilona Sorrel M.D.  On: 03/05/2015 17:04   Impression: 75 year old man with a history of tobacco abuse who has a 3.7 cm spiculated mass in the right upper lobe with mediastinal adenopathy. This is highly suspicious for a new primary bronchogenic carcinoma. His clinical stage is IIIA. He needs a tissue diagnosis to guide treatment.  I had a long discussion with Derek Howard regarding this nodule. He had a difficult time understanding many of the issues that we addressed, and I had to repeat things multiple times to help understand the issues involved.  I recommended that we proceed with a navigational bronchoscopy and endobronchial ultrasound for diagnostic and staging purposes. I have repeated several times this was not a resection would not constitute a treatment for the disease, merely that we were trying to establish a diagnosis. I explained the general nature of the procedure, the need for general anesthesia, that no incisions would be made, and that we would expect to do this on an outpatient basis. I did inform him that he could not drive himself home afterwards. I reviewed the indications, risks, benefits, and alternatives. He understands the risks include those associated with general anesthesia, such as MI, hypoxia, death. He understands procedure specific risks include failure to make a diagnosis, bleeding, pneumothorax, as well as other unforeseeable complications.  He accepts the risks and agrees to proceed.  He is scheduled to have an MR of the brain on Thursday, 03/26/2015. He was unaware of this appointment. We will try to help make sure that he knows where to go and when to get there.  Plan: Navigational bronchoscopy and endobronchial ultrasound on Friday, 03/27/2015.  I spent 45 minutes with Derek Howard during this visit, and greater than was 50% spent in  counseling  Melrose Nakayama, MD [facility] [phone]

## 2015-03-25 ENCOUNTER — Other Ambulatory Visit: Payer: Self-pay | Admitting: *Deleted

## 2015-03-25 DIAGNOSIS — R918 Other nonspecific abnormal finding of lung field: Secondary | ICD-10-CM

## 2015-03-26 ENCOUNTER — Ambulatory Visit (HOSPITAL_COMMUNITY)
Admission: RE | Admit: 2015-03-26 | Discharge: 2015-03-26 | Disposition: A | Discharge status: Home / Self Care | Payer: Commercial Managed Care - HMO | Source: Ambulatory Visit | Attending: Internal Medicine | Admitting: Internal Medicine

## 2015-03-26 ENCOUNTER — Other Ambulatory Visit (HOSPITAL_COMMUNITY): Payer: Commercial Managed Care - HMO

## 2015-03-26 ENCOUNTER — Encounter (HOSPITAL_COMMUNITY): Payer: Self-pay | Admitting: *Deleted

## 2015-03-26 ENCOUNTER — Encounter (HOSPITAL_COMMUNITY)
Admission: RE | Admit: 2015-03-26 | Discharge: 2015-03-26 | Disposition: A | Discharge status: Home / Self Care | Payer: Commercial Managed Care - HMO | Source: Ambulatory Visit | Attending: Physician Assistant | Admitting: Physician Assistant

## 2015-03-26 DIAGNOSIS — C3411 Malignant neoplasm of upper lobe, right bronchus or lung: Secondary | ICD-10-CM | POA: Insufficient documentation

## 2015-03-26 DIAGNOSIS — R918 Other nonspecific abnormal finding of lung field: Secondary | ICD-10-CM | POA: Insufficient documentation

## 2015-03-26 DIAGNOSIS — Z87891 Personal history of nicotine dependence: Secondary | ICD-10-CM | POA: Diagnosis not present

## 2015-03-26 DIAGNOSIS — K7689 Other specified diseases of liver: Secondary | ICD-10-CM | POA: Diagnosis not present

## 2015-03-26 DIAGNOSIS — K769 Liver disease, unspecified: Secondary | ICD-10-CM

## 2015-03-26 DIAGNOSIS — M1990 Unspecified osteoarthritis, unspecified site: Secondary | ICD-10-CM | POA: Diagnosis not present

## 2015-03-26 DIAGNOSIS — I2510 Atherosclerotic heart disease of native coronary artery without angina pectoris: Secondary | ICD-10-CM | POA: Diagnosis not present

## 2015-03-26 DIAGNOSIS — I10 Essential (primary) hypertension: Secondary | ICD-10-CM | POA: Diagnosis not present

## 2015-03-26 DIAGNOSIS — Z79899 Other long term (current) drug therapy: Secondary | ICD-10-CM | POA: Diagnosis not present

## 2015-03-26 DIAGNOSIS — Z85828 Personal history of other malignant neoplasm of skin: Secondary | ICD-10-CM | POA: Diagnosis not present

## 2015-03-26 DIAGNOSIS — C771 Secondary and unspecified malignant neoplasm of intrathoracic lymph nodes: Secondary | ICD-10-CM | POA: Diagnosis not present

## 2015-03-26 LAB — GLUCOSE, CAPILLARY: Glucose-Capillary: 109 mg/dL — ABNORMAL HIGH (ref 65–99)

## 2015-03-26 MED ORDER — FLUDEOXYGLUCOSE F - 18 (FDG) INJECTION
8.8000 | Freq: Once | INTRAVENOUS | Status: AC | PRN
  Administered 2015-03-26: 8.8 via INTRAVENOUS

## 2015-03-26 MED ORDER — GADOBENATE DIMEGLUMINE 529 MG/ML IV SOLN
15.0000 mL | Freq: Once | INTRAVENOUS | Status: AC | PRN
  Administered 2015-03-26: 15 mL via INTRAVENOUS

## 2015-03-26 NOTE — Progress Notes (Signed)
i called patients home number numerous times during the day.  I all mobile phone at [place], patient answered, yelling, "what , cant I get a break from this phone."  I told patient why I was calling and he said that he only had 2 minutes left on the phone.  I asked patient if he could talk on his home phone and he said in about an hour.  I told patient that I would call home phone and leave instructions.    I left  A message on voice mail.  I instructed the patient to arrive at [place] entrance at 5:30am   , nothing to eat or drink after midnight.   I instructed the patient to take the following medications in the am with just enough water to get them down: Duloxetine.  Tramadol- acetaminophen if needed. I asked patient to not wear any lotions, powders, cologne, jewelry, piercing, make-up or nail polish.  I asked the patient to call [phone], in the am if there were any questions or problems.

## 2015-03-27 ENCOUNTER — Ambulatory Visit (HOSPITAL_COMMUNITY): Payer: Commercial Managed Care - HMO

## 2015-03-27 ENCOUNTER — Encounter (HOSPITAL_COMMUNITY): Payer: Self-pay | Admitting: Certified Registered Nurse Anesthetist

## 2015-03-27 ENCOUNTER — Ambulatory Visit (HOSPITAL_COMMUNITY): Payer: Commercial Managed Care - HMO | Admitting: Anesthesiology

## 2015-03-27 ENCOUNTER — Ambulatory Visit (HOSPITAL_COMMUNITY)
Admission: RE | Admit: 2015-03-27 | Discharge: 2015-03-27 | Disposition: A | Discharge status: Home / Self Care | Payer: Commercial Managed Care - HMO | Source: Ambulatory Visit | Attending: Thoracic Surgery (Cardiothoracic Vascular Surgery) | Admitting: Thoracic Surgery (Cardiothoracic Vascular Surgery)

## 2015-03-27 ENCOUNTER — Encounter (HOSPITAL_COMMUNITY)
Admission: RE | Disposition: A | Discharge status: Home / Self Care | Payer: Self-pay | Source: Ambulatory Visit | Attending: Thoracic Surgery (Cardiothoracic Vascular Surgery)

## 2015-03-27 DIAGNOSIS — I10 Essential (primary) hypertension: Secondary | ICD-10-CM | POA: Diagnosis not present

## 2015-03-27 DIAGNOSIS — C771 Secondary and unspecified malignant neoplasm of intrathoracic lymph nodes: Secondary | ICD-10-CM | POA: Insufficient documentation

## 2015-03-27 DIAGNOSIS — C3411 Malignant neoplasm of upper lobe, right bronchus or lung: Secondary | ICD-10-CM | POA: Diagnosis not present

## 2015-03-27 DIAGNOSIS — Z87891 Personal history of nicotine dependence: Secondary | ICD-10-CM | POA: Insufficient documentation

## 2015-03-27 DIAGNOSIS — Z01818 Encounter for other preprocedural examination: Secondary | ICD-10-CM | POA: Diagnosis not present

## 2015-03-27 DIAGNOSIS — I2510 Atherosclerotic heart disease of native coronary artery without angina pectoris: Secondary | ICD-10-CM | POA: Diagnosis not present

## 2015-03-27 DIAGNOSIS — Z79899 Other long term (current) drug therapy: Secondary | ICD-10-CM | POA: Insufficient documentation

## 2015-03-27 DIAGNOSIS — M1990 Unspecified osteoarthritis, unspecified site: Secondary | ICD-10-CM | POA: Diagnosis not present

## 2015-03-27 DIAGNOSIS — R918 Other nonspecific abnormal finding of lung field: Secondary | ICD-10-CM | POA: Diagnosis not present

## 2015-03-27 DIAGNOSIS — Z85828 Personal history of other malignant neoplasm of skin: Secondary | ICD-10-CM | POA: Insufficient documentation

## 2015-03-27 DIAGNOSIS — F1020 Alcohol dependence, uncomplicated: Secondary | ICD-10-CM | POA: Diagnosis not present

## 2015-03-27 DIAGNOSIS — R590 Localized enlarged lymph nodes: Secondary | ICD-10-CM | POA: Diagnosis not present

## 2015-03-27 DIAGNOSIS — Z419 Encounter for procedure for purposes other than remedying health state, unspecified: Secondary | ICD-10-CM

## 2015-03-27 HISTORY — PX: VIDEO BRONCHOSCOPY WITH ENDOBRONCHIAL ULTRASOUND: SHX6177

## 2015-03-27 HISTORY — PX: VIDEO BRONCHOSCOPY WITH ENDOBRONCHIAL NAVIGATION: SHX6175

## 2015-03-27 HISTORY — DX: Essential (primary) hypertension: I10

## 2015-03-27 LAB — PROTIME-INR
INR: 1.05 (ref 0.00–1.49)
PROTHROMBIN TIME: 13.9 s (ref 11.6–15.2)

## 2015-03-27 LAB — CBC
HEMATOCRIT: 40 % (ref 39.0–52.0)
Hemoglobin: 13.1 g/dL (ref 13.0–17.0)
MCH: 28.3 pg (ref 26.0–34.0)
MCHC: 32.8 g/dL (ref 30.0–36.0)
MCV: 86.4 fL (ref 78.0–100.0)
Platelets: 305 10*3/uL (ref 150–400)
RBC: 4.63 MIL/uL (ref 4.22–5.81)
RDW: 13.7 % (ref 11.5–15.5)
WBC: 10.2 10*3/uL (ref 4.0–10.5)

## 2015-03-27 LAB — COMPREHENSIVE METABOLIC PANEL
ALT: 21 U/L (ref 17–63)
ANION GAP: 14 (ref 5–15)
AST: 21 U/L (ref 15–41)
Albumin: 3.4 g/dL — ABNORMAL LOW (ref 3.5–5.0)
Alkaline Phosphatase: 54 U/L (ref 38–126)
BILIRUBIN TOTAL: 0.5 mg/dL (ref 0.3–1.2)
BUN: 19 mg/dL (ref 6–20)
CO2: 25 mmol/L (ref 22–32)
Calcium: 8.9 mg/dL (ref 8.9–10.3)
Chloride: 101 mmol/L (ref 101–111)
Creatinine, Ser: 0.98 mg/dL (ref 0.61–1.24)
Glucose, Bld: 118 mg/dL — ABNORMAL HIGH (ref 65–99)
POTASSIUM: 3.2 mmol/L — AB (ref 3.5–5.1)
Sodium: 140 mmol/L (ref 135–145)
TOTAL PROTEIN: 6.8 g/dL (ref 6.5–8.1)

## 2015-03-27 LAB — APTT: aPTT: 27 seconds (ref 24–37)

## 2015-03-27 SURGERY — VIDEO BRONCHOSCOPY WITH ENDOBRONCHIAL NAVIGATION
Anesthesia: General

## 2015-03-27 MED ORDER — ONDANSETRON HCL 4 MG/2ML IJ SOLN
INTRAMUSCULAR | Status: DC | PRN
  Administered 2015-03-27: 4 mg via INTRAVENOUS

## 2015-03-27 MED ORDER — 0.9 % SODIUM CHLORIDE (POUR BTL) OPTIME
TOPICAL | Status: DC | PRN
  Administered 2015-03-27: 1000 mL

## 2015-03-27 MED ORDER — ONDANSETRON HCL 4 MG/2ML IJ SOLN
INTRAMUSCULAR | Status: AC
  Filled 2015-03-27: qty 2

## 2015-03-27 MED ORDER — NEOSTIGMINE METHYLSULFATE 10 MG/10ML IV SOLN
INTRAVENOUS | Status: DC | PRN
  Administered 2015-03-27: 3 mg via INTRAVENOUS

## 2015-03-27 MED ORDER — ONDANSETRON HCL 4 MG/2ML IJ SOLN: 4.0000 mg | Freq: Once | INTRAMUSCULAR | Status: DC | PRN

## 2015-03-27 MED ORDER — MIDAZOLAM HCL 2 MG/2ML IJ SOLN
INTRAMUSCULAR | Status: AC
  Filled 2015-03-27: qty 2

## 2015-03-27 MED ORDER — ROCURONIUM BROMIDE 50 MG/5ML IV SOLN
INTRAVENOUS | Status: AC
  Filled 2015-03-27: qty 1

## 2015-03-27 MED ORDER — PROPOFOL 10 MG/ML IV BOLUS
INTRAVENOUS | Status: DC | PRN
  Administered 2015-03-27: 10 mg via INTRAVENOUS
  Administered 2015-03-27: 120 mg via INTRAVENOUS
  Administered 2015-03-27: 10 mg via INTRAVENOUS

## 2015-03-27 MED ORDER — FENTANYL CITRATE (PF) 100 MCG/2ML IJ SOLN
INTRAMUSCULAR | Status: DC | PRN
  Administered 2015-03-27: 50 ug via INTRAVENOUS

## 2015-03-27 MED ORDER — GLYCOPYRROLATE 0.2 MG/ML IJ SOLN
INTRAMUSCULAR | Status: DC | PRN
  Administered 2015-03-27: 0.4 mg via INTRAVENOUS

## 2015-03-27 MED ORDER — NEOSTIGMINE METHYLSULFATE 10 MG/10ML IV SOLN
INTRAVENOUS | Status: AC
  Filled 2015-03-27: qty 1

## 2015-03-27 MED ORDER — FENTANYL CITRATE (PF) 250 MCG/5ML IJ SOLN
INTRAMUSCULAR | Status: AC
  Filled 2015-03-27: qty 5

## 2015-03-27 MED ORDER — ROCURONIUM BROMIDE 100 MG/10ML IV SOLN
INTRAVENOUS | Status: DC | PRN
  Administered 2015-03-27: 40 mg via INTRAVENOUS

## 2015-03-27 MED ORDER — PHENYLEPHRINE HCL 10 MG/ML IJ SOLN
INTRAMUSCULAR | Status: DC | PRN
  Administered 2015-03-27 (×2): 40 ug via INTRAVENOUS

## 2015-03-27 MED ORDER — FENTANYL CITRATE (PF) 100 MCG/2ML IJ SOLN: 25.0000 ug | INTRAMUSCULAR | Status: DC | PRN

## 2015-03-27 MED ORDER — LIDOCAINE HCL 4 % EX SOLN
CUTANEOUS | Status: DC | PRN
  Administered 2015-03-27: 4 mL via TOPICAL

## 2015-03-27 MED ORDER — GLYCOPYRROLATE 0.2 MG/ML IJ SOLN
INTRAMUSCULAR | Status: AC
  Filled 2015-03-27: qty 3

## 2015-03-27 MED ORDER — SUCCINYLCHOLINE CHLORIDE 20 MG/ML IJ SOLN
INTRAMUSCULAR | Status: AC
Start: 2015-03-27 — End: 2015-03-27
  Filled 2015-03-27: qty 1

## 2015-03-27 MED ORDER — PROPOFOL 10 MG/ML IV BOLUS
INTRAVENOUS | Status: AC
  Filled 2015-03-27: qty 40

## 2015-03-27 MED ORDER — LIDOCAINE HCL (CARDIAC) 20 MG/ML IV SOLN
INTRAVENOUS | Status: AC
  Filled 2015-03-27: qty 5

## 2015-03-27 MED ORDER — LACTATED RINGERS IV SOLN
INTRAVENOUS | Status: DC | PRN
  Administered 2015-03-27: 07:00:00 via INTRAVENOUS

## 2015-03-27 SURGICAL SUPPLY — 45 items
ADAPTER BRONCH F/PENTAX (ADAPTER) ×3 IMPLANT
BRUSH CYTOL CELLEBRITY 1.5X140 (MISCELLANEOUS) IMPLANT
BRUSH SUPERTRAX BIOPSY (INSTRUMENTS) IMPLANT
BRUSH SUPERTRAX NDL-TIP CYTO (INSTRUMENTS) ×3 IMPLANT
CANISTER SUCTION 2500CC (MISCELLANEOUS) ×6 IMPLANT
CHANNEL WORK EXTEND EDGE 180 (KITS) IMPLANT
CHANNEL WORK EXTEND EDGE 45 (KITS) IMPLANT
CHANNEL WORK EXTEND EDGE 90 (KITS) IMPLANT
CONT SPEC 4OZ CLIKSEAL STRL BL (MISCELLANEOUS) ×9 IMPLANT
COTTONBALL LRG STERILE PKG (GAUZE/BANDAGES/DRESSINGS) IMPLANT
COVER DOME SNAP 22 D (MISCELLANEOUS) ×3 IMPLANT
COVER TABLE BACK 60X90 (DRAPES) ×6 IMPLANT
FILTER STRAW FLUID ASPIR (MISCELLANEOUS) IMPLANT
FORCEPS BIOP RJ4 1.8 (CUTTING FORCEPS) IMPLANT
FORCEPS BIOP SUPERTRX PREMAR (INSTRUMENTS) ×3 IMPLANT
GAUZE SPONGE 4X4 12PLY STRL (GAUZE/BANDAGES/DRESSINGS) ×3 IMPLANT
GLOVE SURG SIGNA 7.5 PF LTX (GLOVE) ×6 IMPLANT
GOWN STRL REUS W/ TWL XL LVL3 (GOWN DISPOSABLE) ×2 IMPLANT
GOWN STRL REUS W/TWL XL LVL3 (GOWN DISPOSABLE) ×4
KIT CLEAN ENDO COMPLIANCE (KITS) ×9 IMPLANT
KIT PROCEDURE EDGE 180 (KITS) IMPLANT
KIT PROCEDURE EDGE 45 (KITS) IMPLANT
KIT PROCEDURE EDGE 90 (KITS) IMPLANT
KIT ROOM TURNOVER OR (KITS) ×6 IMPLANT
MARKER SKIN DUAL TIP RULER LAB (MISCELLANEOUS) ×6 IMPLANT
NEEDLE 22X1 1/2 (OR ONLY) (NEEDLE) IMPLANT
NEEDLE BIOPSY TRANSBRONCH 21G (NEEDLE) IMPLANT
NEEDLE BLUNT 18X1 FOR OR ONLY (NEEDLE) IMPLANT
NEEDLE SONO TIP II EBUS (NEEDLE) ×3 IMPLANT
NEEDLE SUPERTRX PREMARK BIOPSY (NEEDLE) ×3 IMPLANT
NS IRRIG 1000ML POUR BTL (IV SOLUTION) ×6 IMPLANT
OIL SILICONE PENTAX (PARTS (SERVICE/REPAIRS)) ×3 IMPLANT
PAD ARMBOARD 7.5X6 YLW CONV (MISCELLANEOUS) ×12 IMPLANT
PATCHES PATIENT (LABEL) ×9 IMPLANT
SYR 20CC LL (SYRINGE) ×6 IMPLANT
SYR 20ML ECCENTRIC (SYRINGE) ×6 IMPLANT
SYR 30ML LL (SYRINGE) ×3 IMPLANT
SYR 5ML LL (SYRINGE) ×6 IMPLANT
SYR 5ML LUER SLIP (SYRINGE) ×3 IMPLANT
SYR CONTROL 10ML LL (SYRINGE) IMPLANT
TOWEL OR 17X24 6PK STRL BLUE (TOWEL DISPOSABLE) ×6 IMPLANT
TRAP SPECIMEN MUCOUS 40CC (MISCELLANEOUS) ×6 IMPLANT
TUBE CONNECTING 20'X1/4 (TUBING) ×3
TUBE CONNECTING 20X1/4 (TUBING) ×6 IMPLANT
UNDERPAD 30X30 (UNDERPADS AND DIAPERS) ×3 IMPLANT

## 2015-03-27 NOTE — Progress Notes (Signed)
Dr. Roxan Hockey '@bedside'$  to speak with pt post-op. No CXR needed. Pt has orders to d/c home & f/u with Oncology.

## 2015-03-27 NOTE — Anesthesia Preprocedure Evaluation (Addendum)
Anesthesia Evaluation  Patient identified by MRN, date of birth, ID band Patient awake    Reviewed: Allergy & Precautions, NPO status , Patient's Chart, lab work & pertinent test results  History of Anesthesia Complications Negative for: history of anesthetic complications  Airway Mallampati: II  TM Distance: >3 FB Neck ROM: Full    Dental no notable dental hx. (+) Dental Advisory Given, Missing, Poor Dentition, Edentulous Upper   Pulmonary former smoker,  CT of the chest was done on 03/05/2015 which showed a 3.0 x 3.7 cm spiculated mass in the right upper lobe with mediastinal adenopathy   Pulmonary exam normal breath sounds clear to auscultation       Cardiovascular hypertension, Pt. on medications negative cardio ROS Normal cardiovascular exam Rhythm:Regular Rate:Normal     Neuro/Psych negative neurological ROS  negative psych ROS   GI/Hepatic negative GI ROS, Neg liver ROS, (+)     substance abuse (quit in 2013)  alcohol use,   Endo/Other  negative endocrine ROS  Renal/GU negative Renal ROS  negative genitourinary   Musculoskeletal  (+) Arthritis , Osteoarthritis,    Abdominal   Peds negative pediatric ROS (+)  Hematology negative hematology ROS (+)   Anesthesia Other Findings   Reproductive/Obstetrics negative OB ROS                            Anesthesia Physical Anesthesia Plan  ASA: III  Anesthesia Plan: General   Post-op Pain Management:    Induction: Intravenous  Airway Management Planned: Oral ETT  Additional Equipment:   Intra-op Plan:   Post-operative Plan: Extubation in OR  Informed Consent: I have reviewed the patients History and Physical, chart, labs and discussed the procedure including the risks, benefits and alternatives for the proposed anesthesia with the patient or authorized representative who has indicated his/her understanding and acceptance.    Dental advisory given  Plan Discussed with: CRNA  Anesthesia Plan Comments:         Anesthesia Quick Evaluation

## 2015-03-27 NOTE — Discharge Instructions (Signed)
Do not drive or engage in heavy physical activity for 24 hours  You may resume normal activities tomorrow  You may cough up small amounts of blood over the next few days  Call [phone] if you develop chest pain, shortness of breath, fever or cough up more than two tablespoons of blood  Follow up as scheduled with Dr. Julien Nordmann

## 2015-03-27 NOTE — Transfer of Care (Signed)
Immediate Anesthesia Transfer of Care Note  Patient: Derek Howard  Procedure(s) Performed: Procedure(s): VIDEO BRONCHOSCOPY WITH ENDOBRONCHIAL NAVIGATION (N/A) VIDEO BRONCHOSCOPY WITH ENDOBRONCHIAL ULTRASOUND (N/A)  Patient Location: PACU  Anesthesia Type:General  Level of Consciousness: patient cooperative and responds to stimulation  Airway & Oxygen Therapy: Patient Spontanous Breathing and Patient connected to nasal cannula oxygen  Post-op Assessment: Report given to RN and Post -op Vital signs reviewed and stable  Post vital signs: Reviewed and stable  Last Vitals:  Filed Vitals:   03/27/15 0711  BP: 164/80  Pulse: 102  Temp: 37.2 C  Resp: 18    Complications: No apparent anesthesia complications

## 2015-03-27 NOTE — Anesthesia Procedure Notes (Signed)
Procedure Name: Intubation Performed by: Judeth Cornfield T Pre-anesthesia Checklist: Patient identified, Timeout performed, Emergency Drugs available, Suction available and Patient being monitored Patient Re-evaluated:Patient Re-evaluated prior to inductionOxygen Delivery Method: Circle system utilized Preoxygenation: Pre-oxygenation with 100% oxygen Intubation Type: IV induction Ventilation: Mask ventilation without difficulty and Oral airway inserted - appropriate to patient size Laryngoscope Size: Mac and 3 Grade View: Grade I Tube type: Oral Tube size: 8.5 mm Number of attempts: 1 Airway Equipment and Method: Stylet Placement Confirmation: ETT inserted through vocal cords under direct vision,  breath sounds checked- equal and bilateral,  positive ETCO2 and CO2 detector Secured at: 22 cm Tube secured with: Tape Dental Injury: Teeth and Oropharynx as per pre-operative assessment

## 2015-03-27 NOTE — Interval H&P Note (Signed)
History and Physical Interval Note:  03/27/2015 7:22 AM  Derek Howard  has presented today for surgery, with the diagnosis of RIGHT LUNG MASS MEDIASTINAL ADENOPATHY  The various methods of treatment have been discussed with the patient and family. After consideration of risks, benefits and other options for treatment, the patient has consented to  Procedure(s): VIDEO BRONCHOSCOPY WITH ENDOBRONCHIAL NAVIGATION (N/A) VIDEO BRONCHOSCOPY WITH ENDOBRONCHIAL ULTRASOUND (N/A) as a surgical intervention .  The patient's history has been reviewed, patient examined, no change in status, stable for surgery.  I have reviewed the patient's chart and labs.  Questions were answered to the patient's satisfaction.     Melrose Nakayama

## 2015-03-27 NOTE — Op Note (Signed)
NAME:  RIGLEY, NIESS                 ACCOUNT NO.:  [id number]  MEDICAL RECORD NO.:  [id number]  LOCATION:  [facility]                         FACILITY:  [facility]  PHYSICIAN:  Revonda Standard. Roxan Hockey, M.D.DATE OF BIRTH:  08-May-1940  DATE OF PROCEDURE:  03/27/2015 DATE OF DISCHARGE:  03/27/2015                              OPERATIVE REPORT   PREOPERATIVE DIAGNOSIS:  Right lung mass with mediastinal adenopathy.  POSTOPERATIVE DIAGNOSIS:  Carcinoma stage IIIA.  PROCEDURE:   1.Electromagnetic navigational bronchoscopy with needle aspirations, brushings, and biopsies 2.Endobronchial ultrasound with mediastinal lymph node aspirations.  SURGEON:  Revonda Standard. Roxan Hockey, M.D.  ASSISTANT:  None.  ANESTHESIA:  General.  FINDINGS:  Enlarged nodes in the 4R and 7 stations.  Aspirations showed carcinoma in the 4R node.  No visible endobronchial mass.  Brushings of the right upper lobe mass revealed carcinoma.  CLINICAL NOTE:  Mr. Lanni is a 75 year old man with a history of heavy tobacco abuse, who recently presented with chest pain.  Workup included a chest x-ray, which showed a lung mass.  A CT of the chest showed a 3 x 3.7 cm spiculated mass in the right upper lobe with mediastinal adenopathy.  MRI of the brain was negative for malignancy. PET-CT is pending.  He was advised to undergo navigational bronchoscopy and endobronchial ultrasound for diagnostic and staging purposes.  The indications, risks, benefits, and alternatives were discussed in detail with the patient.  He understood the risks and agreed to proceed.  He did understand this was a diagnostic procedure.  OPERATIVE NOTE:  Mr. Grant was brought to the operating room on March 27, 2015.  He had induction of general anesthesia and was intubated.  After performing a surgical time-out, flexible fiberoptic bronchoscopy was performed via the endotracheal tube.  There were no endobronchial lesions to the level of the subsegmental bronchi.   The bronchoscope was removed.  The endobronchial ultrasound probe was advanced.  Systematic inspection of the mediastinal lymph node stations revealed enlarged nodes in the subcarinal (level 7) nodes and right paratracheal (level 4R) nodes. Aspirations performed of each of these nodes.  With each aspiration, the needle was advanced into the node with ultrasound guidance and 10-12 passes were made with the needle with suction applied.  The specimens then were placed onto slides.  A third aspiration was performed of the 4R node and all of the specimen was placed into cytologic fluid for cell block.  The endobronchial ultrasound probe was withdrawn.  The bronchoscope was reinserted and a locatable guide for navigation was inserted.  Registration was performed and there was good correlation between the virtual and video bronchoscopy.  The bronchoscope then was directed to the right upper lobe bronchus and the appropriate subsegmental bronchus of the apical segment was cannulated with the locatable guide which was within 1 cm of the center of the lesion. Multiple samples then were taken from the right upper lobe mass beginning with needle aspirations, followed by brushings with a needle brush, followed by biopsies.  Once the brushings were performed, the slides were sent to Pathology for quick prep and biopsies were taken while awaiting that result.  The results showed carcinoma in both the  4R node as well as the right upper lobe mass.  On the initial stain, it could not be differentiated whether this was small cell carcinoma or poorly Differentiated non-small cell carcinoma. That will await final pathology.  The biopsies were sent for permanent pathology only.  A final inspection was made with the bronchoscope and there was no ongoing bleeding.  The bronchoscope was removed.  The patient was extubated in the operating room and taken to the postanesthetic care unit in good  condition.     Revonda Standard Roxan Hockey, M.D.     SCH/MEDQ  D:  03/27/2015  T:  03/27/2015  Job:  [id number]

## 2015-03-27 NOTE — Brief Op Note (Signed)
03/27/2015  9:21 AM  PATIENT:  Dyke Maes  75 y.o. male  PRE-OPERATIVE DIAGNOSIS:  RIGHT LUNG MASS, MEDIASTINAL ADENOPATHY  POST-OPERATIVE DIAGNOSIS:  RIGHT LUNG MASS, MEDIASTINAL ADENOPATHY- + carcinoma  PROCEDURE:  Procedure(s): VIDEO BRONCHOSCOPY WITH ENDOBRONCHIAL NAVIGATION (N/A) VIDEO BRONCHOSCOPY WITH ENDOBRONCHIAL ULTRASOUND (N/A)  SURGEON:  Surgeon(s) and Role:    * Melrose Nakayama, MD - Primary  ANESTHESIA:   general  EBL:  Total I/O In: 600 [I.V.:600] Out: -   BLOOD ADMINISTERED:none  DRAINS: none   LOCAL MEDICATIONS USED:  NONE  SPECIMEN:  Source of Specimen:  4R and 7 lymph nodes, RUL mass  DISPOSITION OF SPECIMEN:  PATHOLOGY  PLAN OF CARE: Discharge to home after PACU  PATIENT DISPOSITION:  PACU - hemodynamically stable.   Delay start of Pharmacological VTE agent (>24hrs) due to surgical blood loss or risk of bleeding: not applicable  Quick prep showed carcinoma in 4R node and RUL mass

## 2015-03-27 NOTE — H&P (View-Only) (Signed)
PCP is [name], MD Referring Provider is Curt Bears, MD  Chief Complaint  Patient presents with  . Lung Mass    Surgical eval, Chest CT 03/05/15, PET Scan and MRI Brain Pending    HPI: 75 year old man for consultation regarding a right lung mass with mediastinal adenopathy.  Derek Howard is a 75 year old man with a long history of tobacco abuse (2 packs per day for 50 years prior to quitting 10 years ago), hearing loss, history of ethanol abuse (quit in 2013), and basal cell carcinoma. He was evaluated for back and chest pain at an urgent care in early January. His EKG showed no acute changes in his troponins were negative. Chest x-ray showed a right lung mass. A CT of the chest was done on 03/05/2015 which showed a 3.0 x 3.7 cm spiculated mass in the right upper lobe with mediastinal adenopathy. Was also noted to have coronary atherosclerosis, aortic atherosclerosis, a fluid-filled esophagus and hiatal hernia. There were some questionable lesions in the liver that were too small to characterize.  He quit smoking about 10 years ago. He is a very poor historian and extremely hard of hearing. He says that he has not had any further chest pain since the episode in early January. He attributed that to being on Flexeril. He has lost 5 pounds over the past 3 months, but says his appetite is good. He denies any significant cough or hemoptysis. He denies wheezing or shortness of breath.   Zubrod Score: At the time of surgery this patient's most appropriate activity status/level should be described as: '[]'$     0    Normal activity, no symptoms '[x]'$     1    Restricted in physical strenuous activity but ambulatory, able to do out light work '[]'$     2    Ambulatory and capable of self care, unable to do work activities, up and about >50 % of waking hours                              '[]'$     3    Only limited self care, in bed greater than 50% of waking hours '[]'$     4    Completely disabled, no self  care, confined to bed or chair '[]'$     5    Moribund    Past Medical History  Diagnosis Date  . Cancer ([place])     basal cell carcinoma  . Hearing loss   . Lung mass 03/10/2015  . Alcohol abuse     stopped 2013    No past surgical history on file.  No family history on file.  Social History Social History  Substance Use Topics  . Smoking status: Former Smoker -- 2.00 packs/day for 50 years    Types: Cigarettes    Quit date: 03/23/2005  . Smokeless tobacco: Never Used  . Alcohol Use: No     Comment: stopped drinking 2013    Current Outpatient Prescriptions  Medication Sig Dispense Refill  . cyclobenzaprine (FLEXERIL) 10 MG tablet Take 1 tablet (10 mg total) by mouth 3 (three) times daily as needed for muscle spasms. 30 tablet 0  . DULoxetine (CYMBALTA) 20 MG capsule Take 1 capsule (20 mg total) by mouth daily. 30 capsule 4  . hydrochlorothiazide (MICROZIDE) 12.5 MG capsule Take 1 capsule (12.5 mg total) by mouth daily. 30 capsule 4  . methylPREDNISolone (MEDROL DOSEPAK) 4 MG TBPK tablet  Take 20 mg by mouth. Take as directed    . traMADol-acetaminophen (ULTRACET) 37.5-325 MG tablet Take 1 tablet by mouth every 8 (eight) hours as needed. 90 tablet 0   No current facility-administered medications for this visit.    No Known Allergies  Review of Systems  Constitutional: Positive for unexpected weight change (lost 5 pounds in 3 months). Negative for activity change and appetite change.  HENT: Positive for hearing loss. Negative for trouble swallowing and voice change.   Eyes: Negative for visual disturbance.  Respiratory: Positive for cough. Negative for shortness of breath and wheezing.   Cardiovascular: Positive for chest pain (chest and back pain- ECG and troponin showed no evidence of cardiac source).  Gastrointestinal: Negative for abdominal pain, blood in stool and abdominal distention.  Genitourinary: Negative for hematuria and difficulty urinating.  Musculoskeletal:  Positive for myalgias, back pain, joint swelling and arthralgias.  Neurological: Negative for dizziness, weakness and headaches.  Hematological: Negative for adenopathy. Does not bruise/bleed easily.  All other systems reviewed and are negative.   BP 148/87 mmHg  Pulse 110  Resp 20  Ht '5\' 7"'$  (1.702 m)  Wt 163 lb (73.936 kg)  BMI 25.52 kg/m2  SpO2 93% Physical Exam  Constitutional: He is oriented to person, place, and time.  Disheveled appearance  HENT:  Head: Normocephalic and atraumatic.  Mouth/Throat: No oropharyngeal exudate.  Eyes: Conjunctivae and EOM are normal. Pupils are equal, round, and reactive to light. No scleral icterus.  Neck: Neck supple. No tracheal deviation present. No thyromegaly present.  Cardiovascular: Normal heart sounds.  Exam reveals no gallop and no friction rub.   No murmur heard. Tachy, regular  Pulmonary/Chest: He has no wheezes. He has no rales.  Poor compliance with request for deep breath  Abdominal: Soft. He exhibits no distension. There is no tenderness.  Musculoskeletal: He exhibits no edema.  Lymphadenopathy:    He has no cervical adenopathy.  Neurological: He is alert and oriented to person, place, and time. He exhibits normal muscle tone.  Very hard of hearing. No focal motor deficit  Skin: Skin is warm and dry.  Vitals reviewed.    Diagnostic Tests: CT CHEST WITH CONTRAST  TECHNIQUE: Multidetector CT imaging of the chest was performed during intravenous contrast administration.  CONTRAST: 1m ISOVUE-300 IOPAMIDOL (ISOVUE-300) INJECTION 61%  COMPARISON: 02/23/2015 chest radiograph.  FINDINGS: Mediastinum/Nodes: Normal heart size. No pericardial fluid/thickening. Left circumflex and right coronary atherosclerosis. Great vessels are normal in course and caliber. No central pulmonary emboli. Normal visualized thyroid. Fluid throughout the thoracic esophagus, suggesting esophageal dysmotility and/or gastroesophageal  reflux. No pathologically enlarged axillary lymph nodes. There are mildly to moderately enlarged right paratracheal lymph nodes, largest 1.7 cm (series 3/ image 22). No hilar or contralateral mediastinal lymphadenopathy.  Lungs/Pleura: No pneumothorax. No pleural effusion. Mild centrilobular emphysema. Spiculated 3.7 x 3.0 cm lung mass in the central right upper lobe (series 4/ image 21). Subpleural 4 mm anterior right middle lobe pulmonary nodule associated with the minor fissure (series 4/ image 40). No acute consolidative airspace disease or additional significant pulmonary nodules. Small parenchymal bands in the anterior lingula and anterior left lower lobe, favor mild postinfectious/ postinflammatory scarring.  Upper abdomen: Simple 2.3 cm liver cyst in the posterior lateral segment left liver lobe. There are numerous (greater than 10) additional subcentimeter hypodense liver lesions scattered throughout the visualized liver, too small to characterize. Left adrenal 1.8 cm mass (series 3/ image 61) with indeterminate density of 23 HU. Moderate hiatal hernia.  Otherwise grossly normal visualized stomach .  Musculoskeletal: No aggressive appearing focal osseous lesions. Moderate degenerative changes in the thoracic spine.  IMPRESSION: 1. Spiculated 3.7 cm central right upper lobe lung mass, most in keeping with a primary bronchogenic carcinoma. 2. Ipsilateral mediastinal lymphadenopathy, probably metastatic. 3. Solitary 4 mm right middle lobe pulmonary nodule, cannot exclude ipsilateral lung metastasis. 4. Indeterminate 1.8 cm left adrenal mass. 5. Recommend PET-CT for further staging evaluation of the above findings. Thoracic surgery and oncology consultation is warranted. 6. Innumerable subcentimeter hypodense liver lesions, too small to characterize. Consider further evaluation with MRI abdomen with and without intravenous contrast. 7. Additional findings including  coronary atherosclerosis, mild centrilobular emphysema and moderate hiatal hernia.   Electronically Signed  By: Ilona Sorrel M.D.  On: 03/05/2015 17:04   Impression: 75 year old man with a history of tobacco abuse who has a 3.7 cm spiculated mass in the right upper lobe with mediastinal adenopathy. This is highly suspicious for a new primary bronchogenic carcinoma. His clinical stage is IIIA. He needs a tissue diagnosis to guide treatment.  I had a long discussion with Derek Howard regarding this nodule. He had a difficult time understanding many of the issues that we addressed, and I had to repeat things multiple times to help understand the issues involved.  I recommended that we proceed with a navigational bronchoscopy and endobronchial ultrasound for diagnostic and staging purposes. I have repeated several times this was not a resection would not constitute a treatment for the disease, merely that we were trying to establish a diagnosis. I explained the general nature of the procedure, the need for general anesthesia, that no incisions would be made, and that we would expect to do this on an outpatient basis. I did inform him that he could not drive himself home afterwards. I reviewed the indications, risks, benefits, and alternatives. He understands the risks include those associated with general anesthesia, such as MI, hypoxia, death. He understands procedure specific risks include failure to make a diagnosis, bleeding, pneumothorax, as well as other unforeseeable complications.  He accepts the risks and agrees to proceed.  He is scheduled to have an MR of the brain on Thursday, 03/26/2015. He was unaware of this appointment. We will try to help make sure that he knows where to go and when to get there.  Plan: Navigational bronchoscopy and endobronchial ultrasound on Friday, 03/27/2015.  I spent 45 minutes with Derek Howard during this visit, and greater than was 50% spent in  counseling  Melrose Nakayama, MD [facility] [phone]

## 2015-03-30 ENCOUNTER — Telehealth: Payer: Self-pay | Admitting: *Deleted

## 2015-03-30 NOTE — Telephone Encounter (Signed)
Oncology Nurse Navigator Documentation  Oncology Nurse Navigator Flowsheets 03/30/2015  Navigator Location -  Navigator Encounter Type Telephone/I received notification from Dr. Roxan Hockey regarding pathology.  I updated Dr. Julien Nordmann.  I also updated Dr. Julien Nordmann that patient is set up to see him in March.  He asked that I change that appt to earlier.  I called and left patient a vm message regarding appt change.  He will be seen on 04/09/15 arrive at 2:30.  I also left my name and phone number to call   Telephone Outgoing Call  Treatment Phase Abnormal Scans  Barriers/Navigation Needs Coordination of Care  Interventions Coordination of Care  Coordination of Care Appts  Acuity Level 1  Time Spent with Patient 15

## 2015-03-31 ENCOUNTER — Encounter (HOSPITAL_COMMUNITY): Payer: Self-pay | Admitting: Thoracic Surgery (Cardiothoracic Vascular Surgery)

## 2015-03-31 NOTE — Anesthesia Postprocedure Evaluation (Signed)
Anesthesia Post Note  Patient: Derek Howard  Procedure(s) Performed: Procedure(s) (LRB): VIDEO BRONCHOSCOPY WITH ENDOBRONCHIAL NAVIGATION (N/A) VIDEO BRONCHOSCOPY WITH ENDOBRONCHIAL ULTRASOUND (N/A)  Patient location during evaluation: PACU Anesthesia Type: General Level of consciousness: awake and alert Pain management: pain level controlled Vital Signs Assessment: post-procedure vital signs reviewed and stable Respiratory status: spontaneous breathing, nonlabored ventilation, respiratory function stable and patient connected to nasal cannula oxygen Cardiovascular status: blood pressure returned to baseline and stable Postop Assessment: no signs of nausea or vomiting Anesthetic complications: no    Last Vitals:  Filed Vitals:   03/27/15 1100 03/27/15 1115  BP: 136/59   Pulse: 90   Temp: 36.7 C   Resp: 20 18    Last Pain: There were no vitals filed for this visit.               Arayah Krouse S

## 2015-04-06 ENCOUNTER — Telehealth: Payer: Self-pay | Admitting: Internal Medicine

## 2015-04-06 NOTE — Telephone Encounter (Signed)
Medical Assistant left message on patient's home and cell voicemail. Voicemail states to give a call back to [place] with [facility] at [phone].  !!!Please inform patient of needing to contact the Provider Philis Fendt for refill on Tramadol!!!

## 2015-04-06 NOTE — Telephone Encounter (Signed)
Patient came in requesting a medication refill for Tramadol. Please follow up.

## 2015-04-07 ENCOUNTER — Encounter (HOSPITAL_COMMUNITY): Payer: Self-pay

## 2015-04-08 ENCOUNTER — Telehealth: Payer: Self-pay | Admitting: Medical Oncology

## 2015-04-08 ENCOUNTER — Telehealth: Payer: Self-pay | Admitting: *Deleted

## 2015-04-08 NOTE — Telephone Encounter (Signed)
Called and confirmed 04/09/15 appt w/ pt.

## 2015-04-08 NOTE — Telephone Encounter (Signed)
i called Raquel Sarna at [facility] , left a message that Julien Nordmann will not be sending any alternate specimen for testing.

## 2015-04-09 ENCOUNTER — Ambulatory Visit: Payer: Commercial Managed Care - HMO | Admitting: Physical Therapy

## 2015-04-09 ENCOUNTER — Telehealth: Payer: Self-pay | Admitting: Internal Medicine

## 2015-04-09 ENCOUNTER — Telehealth: Payer: Self-pay | Admitting: *Deleted

## 2015-04-09 ENCOUNTER — Encounter: Payer: Self-pay | Admitting: Internal Medicine

## 2015-04-09 ENCOUNTER — Encounter: Payer: Self-pay | Admitting: *Deleted

## 2015-04-09 ENCOUNTER — Ambulatory Visit (HOSPITAL_BASED_OUTPATIENT_CLINIC_OR_DEPARTMENT_OTHER): Payer: Commercial Managed Care - HMO

## 2015-04-09 ENCOUNTER — Other Ambulatory Visit: Payer: Commercial Managed Care - HMO

## 2015-04-09 ENCOUNTER — Ambulatory Visit
Admission: RE | Admit: 2015-04-09 | Discharge: 2015-04-09 | Disposition: A | Discharge status: Home / Self Care | Payer: Commercial Managed Care - HMO | Source: Ambulatory Visit | Attending: Radiation Oncology | Admitting: Radiation Oncology

## 2015-04-09 ENCOUNTER — Ambulatory Visit (HOSPITAL_BASED_OUTPATIENT_CLINIC_OR_DEPARTMENT_OTHER): Payer: Commercial Managed Care - HMO | Admitting: Internal Medicine

## 2015-04-09 VITALS — BP 137/76 | HR 109 | Temp 100.3°F | Resp 18 | Ht 66.0 in | Wt 161.8 lb

## 2015-04-09 DIAGNOSIS — C3491 Malignant neoplasm of unspecified part of right bronchus or lung: Secondary | ICD-10-CM

## 2015-04-09 DIAGNOSIS — C3411 Malignant neoplasm of upper lobe, right bronchus or lung: Secondary | ICD-10-CM

## 2015-04-09 DIAGNOSIS — C7951 Secondary malignant neoplasm of bone: Secondary | ICD-10-CM | POA: Diagnosis not present

## 2015-04-09 MED ORDER — FOLIC ACID 1 MG PO TABS: 1.0000 mg | ORAL_TABLET | Freq: Every day | ORAL | Status: AC

## 2015-04-09 MED ORDER — PROCHLORPERAZINE MALEATE 10 MG PO TABS: 10.0000 mg | ORAL_TABLET | Freq: Four times a day (QID) | ORAL | Status: DC | PRN

## 2015-04-09 MED ORDER — CYANOCOBALAMIN 1000 MCG/ML IJ SOLN
1000.0000 ug | Freq: Once | INTRAMUSCULAR | Status: AC
  Administered 2015-04-09: 1000 ug via INTRAMUSCULAR

## 2015-04-09 MED ORDER — DEXAMETHASONE 4 MG PO TABS: ORAL_TABLET | ORAL | Status: AC

## 2015-04-09 NOTE — Telephone Encounter (Signed)
Per staff message and POF I have scheduled appts. Advised scheduler of appts. JMW  

## 2015-04-09 NOTE — Telephone Encounter (Signed)
per pof to sch ptappt-gave pt copy of avs-MW sch trmt

## 2015-04-09 NOTE — Progress Notes (Signed)
Derek Howard Clinical Social Work  Clinical Social Work met with patient/family and Futures trader at [facility] appointment to offer support and assess for psychosocial needs.  Medical oncologist reviewed patient's diagnosis and recommended treatment plan with patient/family.  Patient was accompanied by his spouse, Derek Howard, and friend, Shanon Brow.  [name] shared he is "not sure how he feels" right now, patient indicated he plans to talk with his spouse about how he's feeling.  [name] currently drives, patient's spouse can drive when [name] cannot.  Clinical Social Work briefly discussed Clinical Social Work role and [employer] support programs/services.  Clinical Social Work encouraged patient to call with any additional questions or concerns.   Polo Riley, MSW, LCSW, OSW-C Clinical Social Worker [facility] [phone]

## 2015-04-09 NOTE — Progress Notes (Signed)
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  PRIOR THERAPY: None  CURRENT THERAPY: Systemic chemotherapy with carboplatin for AUC of 5 and Alimta 500 MG/M2 every 3 weeks. First dose 04/16/2015  INTERVAL HISTORY: Derek Howard 75 y.o. male returns to the clinic today for follow-up visit accompanied by his wife and a family friend. He is feeling fine today was no specific complaints except for muscle spasm and back pain. He received his muscle relaxant and pain medication from his primary care physician. He denied having any significant chest pain but continues to have shortness breath with exertion. He has no cough or hemoptysis He has no significant weight loss. Has soreness throat recently with low-grade fever. The patient underwent several studies recently including PET scan as well as MRI of the brain. He also underwent electromagnetic navigational bronchoscopy with needle aspiration, brushing and biopsy as well as endobronchial ultrasound with mediastinal lymph node aspirations under the care of Dr. Roxan Hockey on 03/27/2015. The final pathology (Accession: [id number]) was consistent with adenocarcinoma of lung primary. There was insufficient material for molecular studies.  MEDICAL HISTORY: Past Medical History  Diagnosis Date  . Hearing loss   . Lung mass 03/10/2015  . Alcohol abuse     stopped 2013  . Cancer ([facility])     basal cell carcinoma  . Hypertension     ALLERGIES:  has No Known Allergies.  MEDICATIONS:  Current Outpatient Prescriptions  Medication Sig Dispense Refill  . cyclobenzaprine (FLEXERIL) 10 MG tablet Take 1 tablet (10 mg total) by mouth 3  (three) times daily as needed for muscle spasms. (Patient not taking: Reported on 03/27/2015) 30 tablet 0  . DULoxetine (CYMBALTA) 20 MG capsule Take 1 capsule (20 mg total) by mouth daily. 30 capsule 4  . hydrochlorothiazide (MICROZIDE) 12.5 MG capsule Take 1 capsule (12.5 mg total) by mouth daily. 30 capsule 4  . traMADol-acetaminophen (ULTRACET) 37.5-325 MG tablet Take 1 tablet by mouth every 8 (eight) hours as needed. (Patient taking differently: Take 1 tablet by mouth every 8 (eight) hours as needed for moderate pain or severe pain. ) 90 tablet 0   No current facility-administered medications for this visit.    SURGICAL HISTORY:  Past Surgical History  Procedure Laterality Date  . Hernia repair    . Video bronchoscopy with endobronchial navigation N/A 03/27/2015    Procedure: VIDEO BRONCHOSCOPY WITH ENDOBRONCHIAL NAVIGATION;  Surgeon: Melrose Nakayama, MD;  Location: [facility];  Service: Thoracic;  Laterality: N/A;  . Video bronchoscopy with endobronchial ultrasound N/A 03/27/2015    Procedure: VIDEO BRONCHOSCOPY WITH ENDOBRONCHIAL ULTRASOUND;  Surgeon: Melrose Nakayama, MD;  Location: [facility];  Service: Thoracic;  Laterality: N/A;    REVIEW OF SYSTEMS:  Constitutional: positive for fatigue Eyes: negative Ears, nose, mouth, throat, and face: negative Respiratory: positive for dyspnea on exertion Cardiovascular: negative Gastrointestinal: negative Genitourinary:negative Integument/breast: negative Hematologic/lymphatic: negative Musculoskeletal:positive for myalgias Neurological: negative Behavioral/Psych: negative Endocrine: negative Allergic/Immunologic: negative   PHYSICAL EXAMINATION: General appearance: alert, cooperative, fatigued and no distress Head: Normocephalic, without obvious abnormality, atraumatic Neck: no adenopathy, no JVD, supple, symmetrical, trachea midline and thyroid not enlarged, symmetric, no tenderness/mass/nodules Lymph nodes: Cervical, supraclavicular,  and axillary nodes normal.  Resp: clear to auscultation bilaterally Back: symmetric, no curvature. ROM normal. No CVA tenderness. Cardio: regular rate and rhythm, S1, S2 normal, no murmur, click, rub or gallop GI: soft, non-tender; bowel sounds normal; no masses,  no organomegaly Extremities: extremities normal, atraumatic, no cyanosis or edema Neurologic: Alert and oriented X 3, normal strength and tone. Normal symmetric reflexes. Normal coordination and gait  ECOG PERFORMANCE STATUS: 1 - Symptomatic but completely ambulatory  Blood pressure 137/76, pulse 109, temperature 100.3 F (37.9 C), temperature source Oral, resp. rate 18, height '5\' 6"'  (1.676 m), weight 161 lb 12.8 oz (73.392 kg), SpO2 98 %.  LABORATORY DATA: Lab Results  Component Value Date   WBC 10.2 03/27/2015   HGB 13.1 03/27/2015   HCT 40.0 03/27/2015   MCV 86.4 03/27/2015   PLT 305 03/27/2015      Chemistry      Component Value Date/Time   NA 140 03/27/2015 0721   NA 141 03/10/2015 1411   K 3.2* 03/27/2015 0721   K 4.2 03/10/2015 1411   CL 101 03/27/2015 0721   CO2 25 03/27/2015 0721   CO2 26 03/10/2015 1411   BUN 19 03/27/2015 0721   BUN 25.1 03/10/2015 1411   CREATININE 0.98 03/27/2015 0721   CREATININE 0.9 03/10/2015 1411   CREATININE 0.82 02/23/2015 2004      Component Value Date/Time   CALCIUM 8.9 03/27/2015 0721   CALCIUM 9.3 03/10/2015 1411   ALKPHOS 54 03/27/2015 0721   ALKPHOS 62 03/10/2015 1411   AST 21 03/27/2015 0721   AST 20 03/10/2015 1411   ALT 21 03/27/2015 0721   ALT 35 03/10/2015 1411   BILITOT 0.5 03/27/2015 0721   BILITOT <0.30 03/10/2015 1411       RADIOGRAPHIC STUDIES: Dg Chest 2 View  03/27/2015  CLINICAL DATA:  Known right upper lobe lung mass, preoperative evaluation EXAM: CHEST  2 VIEW COMPARISON:  02/23/2015, 03/26/2015 FINDINGS: Cardiac shadow is within normal limits. A hiatal hernia is again identified. The lungs are well aerated bilaterally. A right upper lobe mass  lesion is noted stable from the recent exam. No acute bony abnormality is noted. IMPRESSION: Stable right upper lobe mass lesion. Electronically Signed   By: Inez Catalina M.D.   On: 03/27/2015 07:23   Mr [name] XA Contrast  03/26/2015  CLINICAL DATA:  Right upper lobe lung cancer staging. EXAM: MRI HEAD WITHOUT AND WITH CONTRAST TECHNIQUE: Multiplanar, multiecho pulse sequences of the brain and surrounding structures were obtained without and with intravenous contrast. CONTRAST:  27m MULTIHANCE GADOBENATE DIMEGLUMINE 529 MG/ML IV SOLN COMPARISON:  None. 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 There is no evidence of acute infarct, intracranial hemorrhage, intra-axial mass, midline shift, or extra-axial fluid collection. There is mild cerebral atrophy. No significant cerebral white matter disease is seen. No abnormal enhancement is identified. Orbits are unremarkable. Paranasal sinuses and mastoid air cells are clear. Major intracranial vascular flow voids are preserved. No suspicious skull lesions are identified. IMPRESSION: No evidence of intracranial metastases. Electronically Signed   By: ALogan BoresM.D.   On: 03/26/2015 14:54   Nm Pet Image Initial (pi) Skull Base To Thigh  03/26/2015  CLINICAL DATA:  Initial treatment strategy for right lung mass. EXAM: NUCLEAR MEDICINE PET SKULL BASE TO THIGH TECHNIQUE: 8.8 mCi F-18 FDG was injected intravenously. Full-ring PET imaging was performed from the skull  base to thigh after the radiotracer. CT data was obtained and used for attenuation correction and anatomic localization. FASTING BLOOD GLUCOSE:  Value: 109 mg/dl COMPARISON:  CT chest dated 03/05/2015 FINDINGS: NECK No hypermetabolic lymph nodes in the neck. CHEST 3.7 x 2.8 cm spiculated medial right upper lobe mass (series 6/ image 35), max SUV 23.1, compatible with  primary bronchogenic neoplasm. 5 mm subpleural nodule in the right middle lobe (series 6/image 46), not considered suspicious for metastatic disease based on CT appearance. This is non FDG avid but beneath the size threshold for PET sensitivity. Underlying mild centrilobular emphysematous changes. No focal consolidation. No pleural effusion or pneumothorax. Heart is normal in size. No pericardial effusion. Mild coronary atherosclerosis in the LAD and circumflex coronary arteries. Atherosclerotic calcifications of the aortic arch. Associated right para tracheal lymphadenopathy measuring up to 1.5 cm short axis, max SUV 17.9. Small AP window nodes measuring up to 9 mm short axis (series 4/image 80), max SUV 3.8, indeterminate. ABDOMEN/PELVIS No abnormal hypermetabolic activity within the liver, pancreas, adrenal glands, or spleen. 1.9 cm low-density left adrenal nodule, measuring less than 10 HUs on unenhanced CT and without appreciable hypermetabolism on PET, compatible with a benign adrenal adenoma. 2.5 cm cyst posteriorly in the left hepatic lobe (series 4/ image 117), non FDG avid. Additional scattered probable small hepatic cysts. Left renal cysts measuring up to 2.5 cm in the anterior left upper kidney (series 4/image 136). Atherosclerotic calcifications the abdominal aorta and branch vessels. Prostatomegaly. Bladder is thick-walled although underdistended. High density within the bladder reflects excreted gadolinium. Moderate fat containing right inguinal hernia. No hypermetabolic lymph nodes in the abdomen or pelvis. SKELETON Lytic lesion in the left posterior 6th rib with associated 1.8 x 2.6 cm soft tissue component (series 4/ image 73), max SUV 14.7). Degenerative changes of the visualized thoracolumbar spine. IMPRESSION: Hypermetabolic 3.7 cm spiculated mass in the medial right upper lobe, compatible primary bronchogenic neoplasm. Associated hypermetabolic right paratracheal lymphadenopathy, suspicious for  nodal metastases. Small AP window nodes measuring up to 9 mm short axis, indeterminate. 2.6 cm lytic metastasis in the left posterior 6th rib. 1.9 cm benign left adrenal adenoma. Electronically Signed   By: Julian Hy M.D.   On: 03/26/2015 17:30   Dg C-arm Bronchoscopy  03/27/2015  CLINICAL DATA:  C-ARM BRONCHOSCOPY Fluoroscopy was utilized by the requesting physician.  No radiographic interpretation.    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 I had a lengthy discussion with the patient and his family about his current disease stage, prognosis and treatment options. There was insufficient material from the tissue biopsy for molecular study. I would consider the patient for blood test for EGFR mutation. I discussed with the patient his treatment options including palliative care and hospice referral versus consideration of systemic chemotherapy with carboplatin for AUC of 5 and Alimta 500 MG/M2. The patient is interested in considering systemic chemotherapy. I discussed with the patient adverse effects of this treatment including but not limited to alopecia, myelosuppression, nausea and vomiting, peripheral neuropathy, liver or renal dysfunction. We will arrange for the patient to receive vitamin B 12 injection today. The patient would also receive prescription for Compazine 10 mg by mouth every 6 hours as needed for nausea, Decadron 4 mg by mouth  twice a day, the day before, day of and day after the chemotherapy in addition to folic acid 1 mg by mouth daily. I will arrange for the patient to have a chemotherapy education class before starting the first dose of the chemotherapy. He is expected to start the first cycle of this  treatment on 04/16/2015. The patient would come back for follow-up visit in 2 weeks for reevaluation and management of any adverse effect of his treatment. He was advised to call if he has any concerning symptoms in the interval. The patient voices understanding of current disease status and treatment options and is in agreement with the current care plan.  All questions were answered. The patient knows to call the clinic with any problems, questions or concerns. We can certainly see the patient much sooner if necessary.  Disclaimer: This note was dictated with voice recognition software. Similar sounding words can inadvertently be transcribed and may not be corrected upon review.

## 2015-04-10 ENCOUNTER — Telehealth: Payer: Self-pay | Admitting: *Deleted

## 2015-04-10 NOTE — Telephone Encounter (Signed)
Oncology Nurse Navigator Documentation  Oncology Nurse Navigator Flowsheets 04/10/2015  Navigator Encounter Type Telephone/called Derek Howard mobile phone and his wife Derek Howard answered the phone.  I wanted to follow up from thoracic clinic yesterday.  I updated her on my role and that I would be checking on them through tx.  I asked if I could do anything.  Derek Howard stated nothing now that she had to go.  I updated her on appt for chemo and she verbalized understanding of appt   Telephone Outgoing Call  Treatment Phase Pre-Tx/Tx Discussion  Barriers/Navigation Needs Education  Education Other  Interventions Education Method  Coordination of Care -  Education Method Verbal  Acuity Level 1  Time Spent with Patient 15

## 2015-04-16 ENCOUNTER — Other Ambulatory Visit (HOSPITAL_BASED_OUTPATIENT_CLINIC_OR_DEPARTMENT_OTHER): Payer: Commercial Managed Care - HMO

## 2015-04-16 ENCOUNTER — Ambulatory Visit (HOSPITAL_BASED_OUTPATIENT_CLINIC_OR_DEPARTMENT_OTHER): Payer: Commercial Managed Care - HMO

## 2015-04-16 DIAGNOSIS — Z5111 Encounter for antineoplastic chemotherapy: Secondary | ICD-10-CM

## 2015-04-16 DIAGNOSIS — C7951 Secondary malignant neoplasm of bone: Secondary | ICD-10-CM | POA: Diagnosis not present

## 2015-04-16 DIAGNOSIS — C3491 Malignant neoplasm of unspecified part of right bronchus or lung: Secondary | ICD-10-CM

## 2015-04-16 DIAGNOSIS — C3411 Malignant neoplasm of upper lobe, right bronchus or lung: Secondary | ICD-10-CM

## 2015-04-16 LAB — CBC WITH DIFFERENTIAL/PLATELET
BASO%: 0.4 % (ref 0.0–2.0)
BASOS ABS: 0.1 10*3/uL (ref 0.0–0.1)
EOS%: 0 % (ref 0.0–7.0)
Eosinophils Absolute: 0 10*3/uL (ref 0.0–0.5)
HCT: 39 % (ref 38.4–49.9)
HGB: 12.6 g/dL — ABNORMAL LOW (ref 13.0–17.1)
LYMPH%: 6 % — AB (ref 14.0–49.0)
MCH: 26.6 pg — AB (ref 27.2–33.4)
MCHC: 32.2 g/dL (ref 32.0–36.0)
MCV: 82.4 fL (ref 79.3–98.0)
MONO#: 0.6 10*3/uL (ref 0.1–0.9)
MONO%: 3.3 % (ref 0.0–14.0)
NEUT#: 15.6 10*3/uL — ABNORMAL HIGH (ref 1.5–6.5)
NEUT%: 90.3 % — AB (ref 39.0–75.0)
Platelets: 507 10*3/uL — ABNORMAL HIGH (ref 140–400)
RBC: 4.74 10*6/uL (ref 4.20–5.82)
RDW: 14 % (ref 11.0–14.6)
WBC: 17.3 10*3/uL — ABNORMAL HIGH (ref 4.0–10.3)
lymph#: 1 10*3/uL (ref 0.9–3.3)

## 2015-04-16 LAB — COMPREHENSIVE METABOLIC PANEL
ALT: 23 U/L (ref 0–55)
AST: 27 U/L (ref 5–34)
Albumin: 3.6 g/dL (ref 3.5–5.0)
Alkaline Phosphatase: 83 U/L (ref 40–150)
Anion Gap: 11 mEq/L (ref 3–11)
BUN: 32.6 mg/dL — AB (ref 7.0–26.0)
CALCIUM: 9.5 mg/dL (ref 8.4–10.4)
CHLORIDE: 109 meq/L (ref 98–109)
CO2: 21 meq/L — AB (ref 22–29)
Creatinine: 1.1 mg/dL (ref 0.7–1.3)
EGFR: 64 mL/min/{1.73_m2} — ABNORMAL LOW (ref >90)
GLUCOSE: 131 mg/dL (ref 70–140)
POTASSIUM: 4.4 meq/L (ref 3.5–5.1)
SODIUM: 141 meq/L (ref 136–145)
Total Bilirubin: <0.3 mg/dL (ref 0.20–1.20)
Total Protein: 7.9 g/dL (ref 6.4–8.3)

## 2015-04-16 MED ORDER — SODIUM CHLORIDE 0.9 % IV SOLN
Freq: Once | INTRAVENOUS | Status: AC
  Administered 2015-04-16: 15:00:00 via INTRAVENOUS

## 2015-04-16 MED ORDER — PALONOSETRON HCL INJECTION 0.25 MG/5ML
INTRAVENOUS | Status: AC
  Filled 2015-04-16: qty 5

## 2015-04-16 MED ORDER — SODIUM CHLORIDE 0.9 % IV SOLN
451.0000 mg | Freq: Once | INTRAVENOUS | Status: AC
  Administered 2015-04-16: 450 mg via INTRAVENOUS
  Filled 2015-04-16: qty 45

## 2015-04-16 MED ORDER — SODIUM CHLORIDE 0.9 % IV SOLN
10.0000 mg | Freq: Once | INTRAVENOUS | Status: AC
  Administered 2015-04-16: 10 mg via INTRAVENOUS
  Filled 2015-04-16: qty 1

## 2015-04-16 MED ORDER — PALONOSETRON HCL INJECTION 0.25 MG/5ML
0.2500 mg | Freq: Once | INTRAVENOUS | Status: AC
  Administered 2015-04-16: 0.25 mg via INTRAVENOUS

## 2015-04-16 MED ORDER — SODIUM CHLORIDE 0.9 % IV SOLN
480.0000 mg/m2 | Freq: Once | INTRAVENOUS | Status: AC
  Administered 2015-04-16: 900 mg via INTRAVENOUS
  Filled 2015-04-16: qty 36

## 2015-04-16 NOTE — Patient Instructions (Signed)
[facility] Discharge Instructions for Patients Receiving Chemotherapy  Today you received the following chemotherapy agents: Carboplatin, Alimta. To help prevent nausea and vomiting after your treatment, we encourage you to take your nausea medication.  If you develop nausea and vomiting that is not controlled by your nausea medication, call the clinic.   BELOW ARE SYMPTOMS THAT SHOULD BE REPORTED IMMEDIATELY:  *FEVER GREATER THAN 100.5 F  *CHILLS WITH OR WITHOUT FEVER  NAUSEA AND VOMITING THAT IS NOT CONTROLLED WITH YOUR NAUSEA MEDICATION  *UNUSUAL SHORTNESS OF BREATH  *UNUSUAL BRUISING OR BLEEDING  TENDERNESS IN MOUTH AND THROAT WITH OR WITHOUT PRESENCE OF ULCERS  *URINARY PROBLEMS  *BOWEL PROBLEMS  UNUSUAL RASH Items with * indicate a potential emergency and should be followed up as soon as possible.  Feel free to call the clinic you have any questions or concerns. The clinic phone number is [phone].  Please show the [facility] at check-in to the Emergency Department and triage nurse.

## 2015-04-20 ENCOUNTER — Telehealth: Payer: Self-pay | Admitting: Medical Oncology

## 2015-04-20 NOTE — Telephone Encounter (Signed)
I left message for pt to call back if he is having any problems , side effects from chemo.

## 2015-04-23 ENCOUNTER — Ambulatory Visit: Payer: Commercial Managed Care - HMO | Admitting: Nurse Practitioner

## 2015-04-23 ENCOUNTER — Other Ambulatory Visit: Payer: Commercial Managed Care - HMO

## 2015-04-24 LAB — EPIDERMAL GROWTH FACTOR RECEPTOR (EGFR) MUTATION ANALYSIS

## 2015-04-27 ENCOUNTER — Encounter: Payer: Self-pay | Admitting: *Deleted

## 2015-04-27 ENCOUNTER — Ambulatory Visit: Payer: Commercial Managed Care - HMO | Admitting: Internal Medicine

## 2015-04-27 ENCOUNTER — Telehealth: Payer: Self-pay | Admitting: Internal Medicine

## 2015-04-27 NOTE — Progress Notes (Signed)
Oncology Nurse Navigator Documentation  Oncology Nurse Navigator Flowsheets 04/27/2015  Navigator Location -  Navigator Encounter Type Other/I noticed patient was not scheduled for follow up chemo or appt with provider.  I updated Dr. Julien Nordmann.  I also notified scheduling.  Derek Howard will schedule follow up and chemo  Treatment Phase Treatment  Barriers/Navigation Needs Coordination of Care  Interventions Coordination of Care  Coordination of Care Appts  Acuity Level 1  Time Spent with Patient 15

## 2015-04-27 NOTE — Telephone Encounter (Signed)
lvm for pt regarding to March appt.... °

## 2015-04-30 ENCOUNTER — Ambulatory Visit: Payer: Commercial Managed Care - HMO | Admitting: Nurse Practitioner

## 2015-04-30 ENCOUNTER — Other Ambulatory Visit: Payer: Commercial Managed Care - HMO

## 2015-05-07 ENCOUNTER — Ambulatory Visit (HOSPITAL_BASED_OUTPATIENT_CLINIC_OR_DEPARTMENT_OTHER): Payer: Commercial Managed Care - HMO | Admitting: Nurse Practitioner

## 2015-05-07 ENCOUNTER — Other Ambulatory Visit (HOSPITAL_BASED_OUTPATIENT_CLINIC_OR_DEPARTMENT_OTHER): Payer: Commercial Managed Care - HMO

## 2015-05-07 ENCOUNTER — Ambulatory Visit (HOSPITAL_BASED_OUTPATIENT_CLINIC_OR_DEPARTMENT_OTHER): Payer: Commercial Managed Care - HMO

## 2015-05-07 ENCOUNTER — Encounter: Payer: Self-pay | Admitting: Internal Medicine

## 2015-05-07 ENCOUNTER — Other Ambulatory Visit: Payer: Self-pay | Admitting: Internal Medicine

## 2015-05-07 VITALS — BP 153/64 | HR 83 | Temp 98.5°F | Resp 20

## 2015-05-07 DIAGNOSIS — Z5111 Encounter for antineoplastic chemotherapy: Secondary | ICD-10-CM | POA: Diagnosis not present

## 2015-05-07 DIAGNOSIS — C3491 Malignant neoplasm of unspecified part of right bronchus or lung: Secondary | ICD-10-CM

## 2015-05-07 DIAGNOSIS — R530 Neoplastic (malignant) related fatigue: Secondary | ICD-10-CM | POA: Diagnosis not present

## 2015-05-07 LAB — CBC WITH DIFFERENTIAL/PLATELET
BASO%: 0.1 % (ref 0.0–2.0)
Basophils Absolute: 0 10*3/uL (ref 0.0–0.1)
EOS ABS: 0 10*3/uL (ref 0.0–0.5)
EOS%: 0 % (ref 0.0–7.0)
HEMATOCRIT: 38.3 % — AB (ref 38.4–49.9)
HGB: 12.5 g/dL — ABNORMAL LOW (ref 13.0–17.1)
LYMPH#: 1.4 10*3/uL (ref 0.9–3.3)
LYMPH%: 11.8 % — ABNORMAL LOW (ref 14.0–49.0)
MCH: 27.7 pg (ref 27.2–33.4)
MCHC: 32.6 g/dL (ref 32.0–36.0)
MCV: 84.9 fL (ref 79.3–98.0)
MONO#: 1.6 10*3/uL — AB (ref 0.1–0.9)
MONO%: 13.7 % (ref 0.0–14.0)
NEUT#: 8.7 10*3/uL — ABNORMAL HIGH (ref 1.5–6.5)
NEUT%: 74.4 % (ref 39.0–75.0)
PLATELETS: 491 10*3/uL — AB (ref 140–400)
RBC: 4.51 10*6/uL (ref 4.20–5.82)
RDW: 14.9 % — ABNORMAL HIGH (ref 11.0–14.6)
WBC: 11.7 10*3/uL — ABNORMAL HIGH (ref 4.0–10.3)

## 2015-05-07 LAB — COMPREHENSIVE METABOLIC PANEL
ALT: 18 U/L (ref 0–55)
ANION GAP: 9 meq/L (ref 3–11)
AST: 12 U/L (ref 5–34)
Albumin: 3.4 g/dL — ABNORMAL LOW (ref 3.5–5.0)
Alkaline Phosphatase: 63 U/L (ref 40–150)
BUN: 23.4 mg/dL (ref 7.0–26.0)
CALCIUM: 9.4 mg/dL (ref 8.4–10.4)
CHLORIDE: 111 meq/L — AB (ref 98–109)
CO2: 22 mEq/L (ref 22–29)
CREATININE: 0.8 mg/dL (ref 0.7–1.3)
EGFR: 86 mL/min/{1.73_m2} — ABNORMAL LOW (ref >90)
Glucose: 168 mg/dl — ABNORMAL HIGH (ref 70–140)
Potassium: 4.6 mEq/L (ref 3.5–5.1)
Sodium: 142 mEq/L (ref 136–145)
TOTAL PROTEIN: 7.3 g/dL (ref 6.4–8.3)

## 2015-05-07 MED ORDER — PROCHLORPERAZINE MALEATE 10 MG PO TABS
ORAL_TABLET | ORAL | Status: AC
  Filled 2015-05-07: qty 1

## 2015-05-07 MED ORDER — HEPARIN SOD (PORK) LOCK FLUSH 100 UNIT/ML IV SOLN
500.0000 [IU] | Freq: Once | INTRAVENOUS | Status: DC | PRN
  Filled 2015-05-07: qty 5

## 2015-05-07 MED ORDER — PALONOSETRON HCL INJECTION 0.25 MG/5ML
0.2500 mg | Freq: Once | INTRAVENOUS | Status: AC
  Administered 2015-05-07: 0.25 mg via INTRAVENOUS

## 2015-05-07 MED ORDER — PALONOSETRON HCL INJECTION 0.25 MG/5ML
INTRAVENOUS | Status: AC
  Filled 2015-05-07: qty 5

## 2015-05-07 MED ORDER — DEXAMETHASONE SODIUM PHOSPHATE 100 MG/10ML IJ SOLN
10.0000 mg | Freq: Once | INTRAMUSCULAR | Status: AC
  Administered 2015-05-07: 10 mg via INTRAVENOUS
  Filled 2015-05-07: qty 1

## 2015-05-07 MED ORDER — SODIUM CHLORIDE 0.9 % IV SOLN
Freq: Once | INTRAVENOUS | Status: AC
  Administered 2015-05-07: 15:00:00 via INTRAVENOUS

## 2015-05-07 MED ORDER — SODIUM CHLORIDE 0.9 % IV SOLN
446.0000 mg | Freq: Once | INTRAVENOUS | Status: AC
  Administered 2015-05-07: 450 mg via INTRAVENOUS
  Filled 2015-05-07: qty 45

## 2015-05-07 MED ORDER — PROCHLORPERAZINE MALEATE 10 MG PO TABS
10.0000 mg | ORAL_TABLET | Freq: Four times a day (QID) | ORAL | Status: DC | PRN
  Administered 2015-05-07: 10 mg via ORAL

## 2015-05-07 MED ORDER — SODIUM CHLORIDE 0.9% FLUSH
10.0000 mL | INTRAVENOUS | Status: DC | PRN
  Filled 2015-05-07: qty 10

## 2015-05-07 MED ORDER — SODIUM CHLORIDE 0.9 % IV SOLN
480.0000 mg/m2 | Freq: Once | INTRAVENOUS | Status: AC
  Administered 2015-05-07: 900 mg via INTRAVENOUS
  Filled 2015-05-07: qty 36

## 2015-05-07 NOTE — Progress Notes (Signed)
Met w/ regarding copay assistance.  Unfortunately there aren't any foundations offering copay assistance for his Dx so I offered the [place] but pt declined applying for the grant.

## 2015-05-07 NOTE — Patient Instructions (Signed)
[facility] Discharge Instructions for Patients Receiving Chemotherapy  Today you received the following chemotherapy agents Alimta and Carboplatin. To help prevent nausea and vomiting after your treatment, we encourage you to take your nausea medication as directed.  NO ZOFRAN FOR 3 DAYS, TAKE COMPAZINE THOSE DAYS INSTEAD  If you develop nausea and vomiting that is not controlled by your nausea medication, call the clinic.   BELOW ARE SYMPTOMS THAT SHOULD BE REPORTED IMMEDIATELY:  *FEVER GREATER THAN 100.5 F  *CHILLS WITH OR WITHOUT FEVER  NAUSEA AND VOMITING THAT IS NOT CONTROLLED WITH YOUR NAUSEA MEDICATION  *UNUSUAL SHORTNESS OF BREATH  *UNUSUAL BRUISING OR BLEEDING  TENDERNESS IN MOUTH AND THROAT WITH OR WITHOUT PRESENCE OF ULCERS  *URINARY PROBLEMS  *BOWEL PROBLEMS  UNUSUAL RASH Items with * indicate a potential emergency and should be followed up as soon as possible.  Feel free to call the clinic you have any questions or concerns. The clinic phone number is [phone].  Please show the [facility] at check-in to the Emergency Department and triage nurse.

## 2015-05-08 ENCOUNTER — Encounter: Payer: Self-pay | Admitting: Nurse Practitioner

## 2015-05-08 NOTE — Assessment & Plan Note (Signed)
Patient presented to the [facility] today to receive cycle 2 of his carboplatin/Alimta chemotherapy regimen.  Patient stated that he's been doing very well since his first cycle of chemotherapy.  He states that his energy level remains stable.  He states it is both eating and drinking well.  He denies any nausea, vomiting, diarrhea, or constipation.  He denies any recent fevers or chills.  It does appear the patient is mildly hard of hearing.  On exam today.  Patient appears fatigued; but essentially well.  His abdomen is slightly distended; but soft with bowel sounds positive in all 4 quads.  Patient states that his abdomen is always distended.  He denies any abdominal discomfort.  Labs obtained today were all within normal limits.  Patient will proceed today with cycle 2 of his chemotherapy.  Patient will continue with weekly labs.  Patient has plans to return on 05/28/2015 for labs, visit, and his next cycle of chemotherapy.

## 2015-05-08 NOTE — Progress Notes (Signed)
SYMPTOM MANAGEMENT CLINIC   HPI: Derek Howard 75 y.o. male diagnosed with lung cancer.  Currently undergoing carboplatin/Alimta chemotherapy regimen.   Patient presented to the [facility] today to receive cycle 2 of his carboplatin/Alimta chemotherapy regimen.  Patient stated that he's been doing very well since his first cycle of chemotherapy.  He states that his energy level remains stable.  He states it is both eating and drinking well.  He denies any nausea, vomiting, diarrhea, or constipation.  He denies any recent fevers or chills.  It does appear the patient is mildly hard of hearing.  On exam today.  Patient appears fatigued; but essentially well.  His abdomen is slightly distended; but soft with bowel sounds positive in all 4 quads.  Patient states that his abdomen is always distended.  He denies any abdominal discomfort.  Labs obtained today were all within normal limits.  Patient will proceed today with cycle 2 of his chemotherapy.  Patient will continue with weekly labs.  Patient has plans to return on 05/28/2015 for labs, visit, and his next cycle of chemotherapy.  HPI  Review of Systems  All other systems reviewed and are negative.   Past Medical History  Diagnosis Date  . Hearing loss   . Lung mass 03/10/2015  . Alcohol abuse     stopped 2013  . Cancer ([place])     basal cell carcinoma  . Hypertension     Past Surgical History  Procedure Laterality Date  . Hernia repair    . Video bronchoscopy with endobronchial navigation N/A 03/27/2015    Procedure: VIDEO BRONCHOSCOPY WITH ENDOBRONCHIAL NAVIGATION;  Surgeon: Melrose Nakayama, MD;  Location: [facility];  Service: Thoracic;  Laterality: N/A;  . Video bronchoscopy with endobronchial ultrasound N/A 03/27/2015    Procedure: VIDEO BRONCHOSCOPY WITH ENDOBRONCHIAL ULTRASOUND;  Surgeon: Melrose Nakayama, MD;  Location: [facility];  Service: Thoracic;  Laterality: N/A;    has Osteoarthritis; Chronic low back pain;  Rash; Lung mass; and Non-small cell carcinoma of right lung, stage 4 ([place]) on his problem list.    has No Known Allergies.    Medication List       This list is accurate as of: 05/07/15 11:59 PM.  Always use your most recent med list.               cyclobenzaprine 10 MG tablet  Commonly known as:  FLEXERIL  Take 1 tablet (10 mg total) by mouth 3 (three) times daily as needed for muscle spasms.     dexamethasone 4 MG tablet  Commonly known as:  DECADRON  4 mg by mouth twice a day the day before, day of and day after chemotherapy every 3 weeks     DULoxetine 20 MG capsule  Commonly known as:  CYMBALTA  Take 1 capsule (20 mg total) by mouth daily.     folic acid 1 MG tablet  Commonly known as:  FOLVITE  Take 1 tablet (1 mg total) by mouth daily.     hydrochlorothiazide 12.5 MG capsule  Commonly known as:  MICROZIDE  Take 1 capsule (12.5 mg total) by mouth daily.     prochlorperazine 10 MG tablet  Commonly known as:  COMPAZINE  Take 1 tablet (10 mg total) by mouth every 6 (six) hours as needed for nausea or vomiting.     traMADol-acetaminophen 37.5-325 MG tablet  Commonly known as:  ULTRACET  Take 1 tablet by mouth every 8 (eight) hours as needed.  PHYSICAL EXAMINATION  Oncology Vitals 05/07/2015 04/09/2015  Height - -  Weight - 73.392 kg  Weight (lbs) - 161 lbs 13 oz  BMI (kg/m2) - -  Temp 98.5 100.3  Pulse 83 109  Resp 20 19  SpO2 97 98  BSA (m2) - -   BP Readings from Last 2 Encounters:  05/07/15 153/64  04/09/15 137/76    Physical Exam  Constitutional: He is oriented to person, place, and time and well-developed, well-nourished, and in no distress.  HENT:  Head: Normocephalic and atraumatic.  Mouth/Throat: Oropharynx is clear and moist.  Hard of hearing.  Eyes: Conjunctivae and EOM are normal. Pupils are equal, round, and reactive to light. Right eye exhibits no discharge. Left eye exhibits no discharge. No scleral icterus.  Neck: Normal range  of motion. Neck supple. No JVD present. No tracheal deviation present. No thyromegaly present.  Cardiovascular: Normal rate, regular rhythm, normal heart sounds and intact distal pulses.   Pulmonary/Chest: Effort normal and breath sounds normal. No respiratory distress. He has no wheezes. He has no rales. He exhibits no tenderness.  Abdominal: Soft. Bowel sounds are normal. He exhibits no distension and no mass. There is no tenderness. There is no rebound and no guarding.  Musculoskeletal: Normal range of motion. He exhibits no edema or tenderness.  Lymphadenopathy:    He has no cervical adenopathy.  Neurological: He is alert and oriented to person, place, and time.  Skin: Skin is warm and dry.  Psychiatric: Affect normal.  Nursing note reviewed.   LABORATORY DATA:. Appointment on 05/07/2015  Component Date Value Ref Range Status  . WBC 05/07/2015 11.7* 4.0 - 10.3 10e3/uL Final  . NEUT# 05/07/2015 8.7* 1.5 - 6.5 10e3/uL Final  . HGB 05/07/2015 12.5* 13.0 - 17.1 g/dL Final  . HCT 05/07/2015 38.3* 38.4 - 49.9 % Final  . Platelets 05/07/2015 491* 140 - 400 10e3/uL Final  . MCV 05/07/2015 84.9  79.3 - 98.0 fL Final  . MCH 05/07/2015 27.7  27.2 - 33.4 pg Final  . MCHC 05/07/2015 32.6  32.0 - 36.0 g/dL Final  . RBC 05/07/2015 4.51  4.20 - 5.82 10e6/uL Final  . RDW 05/07/2015 14.9* 11.0 - 14.6 % Final  . lymph# 05/07/2015 1.4  0.9 - 3.3 10e3/uL Final  . MONO# 05/07/2015 1.6* 0.1 - 0.9 10e3/uL Final  . Eosinophils Absolute 05/07/2015 0.0  0.0 - 0.5 10e3/uL Final  . Basophils Absolute 05/07/2015 0.0  0.0 - 0.1 10e3/uL Final  . NEUT% 05/07/2015 74.4  39.0 - 75.0 % Final  . LYMPH% 05/07/2015 11.8* 14.0 - 49.0 % Final  . MONO% 05/07/2015 13.7  0.0 - 14.0 % Final  . EOS% 05/07/2015 0.0  0.0 - 7.0 % Final  . BASO% 05/07/2015 0.1  0.0 - 2.0 % Final  . Sodium 05/07/2015 142  136 - 145 mEq/L Final  . Potassium 05/07/2015 4.6  3.5 - 5.1 mEq/L Final  . Chloride 05/07/2015 111* 98 - 109 mEq/L Final    . CO2 05/07/2015 22  22 - 29 mEq/L Final  . Glucose 05/07/2015 168* 70 - 140 mg/dl Final   Glucose reference range is for nonfasting patients. Fasting glucose reference range is 70- 100.  Marland Kitchen BUN 05/07/2015 23.4  7.0 - 26.0 mg/dL Final  . Creatinine 05/07/2015 0.8  0.7 - 1.3 mg/dL Final  . Total Bilirubin 05/07/2015 <0.30  0.20 - 1.20 mg/dL Final  . Alkaline Phosphatase 05/07/2015 63  40 - 150 U/L Final  . AST 05/07/2015  12  5 - 34 U/L Final  . ALT 05/07/2015 18  0 - 55 U/L Final  . Total Protein 05/07/2015 7.3  6.4 - 8.3 g/dL Final  . Albumin 05/07/2015 3.4* 3.5 - 5.0 g/dL Final  . Calcium 05/07/2015 9.4  8.4 - 10.4 mg/dL Final  . Anion Gap 05/07/2015 9  3 - 11 mEq/L Final  . EGFR 05/07/2015 86* >90 ml/min/1.73 m2 Final   eGFR is calculated using the CKD-EPI Creatinine Equation (2009)     RADIOGRAPHIC STUDIES: No results found.  ASSESSMENT/PLAN:    Non-small cell carcinoma of right lung, stage 4 ([place]) Patient presented to the [facility] today to receive cycle 2 of his carboplatin/Alimta chemotherapy regimen.  Patient stated that he's been doing very well since his first cycle of chemotherapy.  He states that his energy level remains stable.  He states it is both eating and drinking well.  He denies any nausea, vomiting, diarrhea, or constipation.  He denies any recent fevers or chills.  It does appear the patient is mildly hard of hearing.  On exam today.  Patient appears fatigued; but essentially well.  His abdomen is slightly distended; but soft with bowel sounds positive in all 4 quads.  Patient states that his abdomen is always distended.  He denies any abdominal discomfort.  Labs obtained today were all within normal limits.  Patient will proceed today with cycle 2 of his chemotherapy.  Patient will continue with weekly labs.  Patient has plans to return on 05/28/2015 for labs, visit, and his next cycle of chemotherapy.  Patient stated understanding of all  instructions; and was in agreement with this plan of care. The patient knows to call the clinic with any problems, questions or concerns.   Review/collaboration with Dr. Julien Nordmann regarding all aspects of patient's visit today.   Total time spent with patient was 15 minutes;  with greater than 75 percent of that time spent in face to face counseling regarding patient's symptoms,  and coordination of care and follow up.  Disclaimer:This dictation was prepared with Dragon/digital dictation along with [employer]. Any transcriptional errors that result from this process are unintentional.  Drue Second, NP 05/08/2015

## 2015-05-14 ENCOUNTER — Other Ambulatory Visit: Payer: Commercial Managed Care - HMO

## 2015-05-14 ENCOUNTER — Ambulatory Visit: Payer: Commercial Managed Care - HMO | Attending: Internal Medicine | Admitting: *Deleted

## 2015-05-14 VITALS — BP 138/76 | HR 96 | Temp 99.8°F | Resp 18

## 2015-05-14 DIAGNOSIS — Z23 Encounter for immunization: Secondary | ICD-10-CM | POA: Diagnosis not present

## 2015-05-14 DIAGNOSIS — Z0000 Encounter for general adult medical examination without abnormal findings: Secondary | ICD-10-CM | POA: Insufficient documentation

## 2015-05-14 NOTE — Progress Notes (Signed)
Patient is here for Pneumonia 23 injection  Patient tolerated pneumonia injection well.

## 2015-05-14 NOTE — Patient Instructions (Signed)
Patient tolerated pneumonia injection well. Thank you for coming in for your vaccine today.

## 2015-05-21 ENCOUNTER — Other Ambulatory Visit: Payer: Commercial Managed Care - HMO

## 2015-05-28 ENCOUNTER — Ambulatory Visit (HOSPITAL_BASED_OUTPATIENT_CLINIC_OR_DEPARTMENT_OTHER): Payer: Commercial Managed Care - HMO | Admitting: Internal Medicine

## 2015-05-28 ENCOUNTER — Ambulatory Visit (HOSPITAL_BASED_OUTPATIENT_CLINIC_OR_DEPARTMENT_OTHER): Payer: Commercial Managed Care - HMO

## 2015-05-28 ENCOUNTER — Other Ambulatory Visit (HOSPITAL_BASED_OUTPATIENT_CLINIC_OR_DEPARTMENT_OTHER): Payer: Commercial Managed Care - HMO

## 2015-05-28 ENCOUNTER — Encounter: Payer: Self-pay | Admitting: Internal Medicine

## 2015-05-28 VITALS — BP 160/66 | HR 99 | Temp 98.5°F | Resp 19 | Ht 66.0 in | Wt 159.4 lb

## 2015-05-28 DIAGNOSIS — C3411 Malignant neoplasm of upper lobe, right bronchus or lung: Secondary | ICD-10-CM

## 2015-05-28 DIAGNOSIS — C7951 Secondary malignant neoplasm of bone: Secondary | ICD-10-CM

## 2015-05-28 DIAGNOSIS — Z5111 Encounter for antineoplastic chemotherapy: Secondary | ICD-10-CM | POA: Diagnosis not present

## 2015-05-28 DIAGNOSIS — C3491 Malignant neoplasm of unspecified part of right bronchus or lung: Secondary | ICD-10-CM

## 2015-05-28 HISTORY — DX: Encounter for antineoplastic chemotherapy: Z51.11

## 2015-05-28 LAB — CBC WITH DIFFERENTIAL/PLATELET
BASO%: 0.2 % (ref 0.0–2.0)
BASOS ABS: 0 10*3/uL (ref 0.0–0.1)
EOS%: 0 % (ref 0.0–7.0)
Eosinophils Absolute: 0 10*3/uL (ref 0.0–0.5)
HEMATOCRIT: 36.1 % — AB (ref 38.4–49.9)
HGB: 11.6 g/dL — ABNORMAL LOW (ref 13.0–17.1)
LYMPH#: 1.2 10*3/uL (ref 0.9–3.3)
LYMPH%: 10.7 % — ABNORMAL LOW (ref 14.0–49.0)
MCH: 26.7 pg — AB (ref 27.2–33.4)
MCHC: 32.2 g/dL (ref 32.0–36.0)
MCV: 83 fL (ref 79.3–98.0)
MONO#: 1.1 10*3/uL — ABNORMAL HIGH (ref 0.1–0.9)
MONO%: 9.6 % (ref 0.0–14.0)
NEUT#: 8.8 10*3/uL — ABNORMAL HIGH (ref 1.5–6.5)
NEUT%: 79.5 % — AB (ref 39.0–75.0)
Platelets: 507 10*3/uL — ABNORMAL HIGH (ref 140–400)
RBC: 4.35 10*6/uL (ref 4.20–5.82)
RDW: 16.1 % — ABNORMAL HIGH (ref 11.0–14.6)
WBC: 11.1 10*3/uL — ABNORMAL HIGH (ref 4.0–10.3)

## 2015-05-28 LAB — COMPREHENSIVE METABOLIC PANEL
ALT: 12 U/L (ref 0–55)
AST: 11 U/L (ref 5–34)
Albumin: 3.2 g/dL — ABNORMAL LOW (ref 3.5–5.0)
Alkaline Phosphatase: 53 U/L (ref 40–150)
Anion Gap: 9 mEq/L (ref 3–11)
BUN: 21.9 mg/dL (ref 7.0–26.0)
CALCIUM: 9.6 mg/dL (ref 8.4–10.4)
CHLORIDE: 111 meq/L — AB (ref 98–109)
CO2: 24 mEq/L (ref 22–29)
Creatinine: 0.8 mg/dL (ref 0.7–1.3)
EGFR: 85 mL/min/{1.73_m2} — AB (ref >90)
Glucose: 156 mg/dl — ABNORMAL HIGH (ref 70–140)
POTASSIUM: 4.3 meq/L (ref 3.5–5.1)
Sodium: 144 mEq/L (ref 136–145)
Total Bilirubin: <0.3 mg/dL (ref 0.20–1.20)
Total Protein: 7.2 g/dL (ref 6.4–8.3)

## 2015-05-28 MED ORDER — SODIUM CHLORIDE 0.9 % IV SOLN
10.0000 mg | Freq: Once | INTRAVENOUS | Status: AC
  Administered 2015-05-28: 10 mg via INTRAVENOUS
  Filled 2015-05-28: qty 1

## 2015-05-28 MED ORDER — CYANOCOBALAMIN 1000 MCG/ML IJ SOLN
INTRAMUSCULAR | Status: AC
  Filled 2015-05-28: qty 1

## 2015-05-28 MED ORDER — CARBOPLATIN CHEMO INJECTION 600 MG/60ML
450.0000 mg | Freq: Once | INTRAVENOUS | Status: AC
  Administered 2015-05-28: 450 mg via INTRAVENOUS
  Filled 2015-05-28: qty 45

## 2015-05-28 MED ORDER — CYANOCOBALAMIN 1000 MCG/ML IJ SOLN
1000.0000 ug | Freq: Once | INTRAMUSCULAR | Status: AC
  Administered 2015-05-28: 1000 ug via INTRAMUSCULAR

## 2015-05-28 MED ORDER — PALONOSETRON HCL INJECTION 0.25 MG/5ML
INTRAVENOUS | Status: AC
  Filled 2015-05-28: qty 5

## 2015-05-28 MED ORDER — PALONOSETRON HCL INJECTION 0.25 MG/5ML
0.2500 mg | Freq: Once | INTRAVENOUS | Status: AC
  Administered 2015-05-28: 0.25 mg via INTRAVENOUS

## 2015-05-28 MED ORDER — SODIUM CHLORIDE 0.9 % IV SOLN
Freq: Once | INTRAVENOUS | Status: AC
  Administered 2015-05-28: 13:00:00 via INTRAVENOUS

## 2015-05-28 MED ORDER — SODIUM CHLORIDE 0.9 % IV SOLN
493.0000 mg/m2 | Freq: Once | INTRAVENOUS | Status: AC
  Administered 2015-05-28: 900 mg via INTRAVENOUS
  Filled 2015-05-28: qty 32

## 2015-05-28 NOTE — Patient Instructions (Signed)
[facility] Discharge Instructions for Patients Receiving Chemotherapy  Today you received the following chemotherapy agents: Alimta, Carboplatin  To help prevent nausea and vomiting after your treatment, we encourage you to take your nausea medication as prescribed by your physician.   If you develop nausea and vomiting that is not controlled by your nausea medication, call the clinic.   BELOW ARE SYMPTOMS THAT SHOULD BE REPORTED IMMEDIATELY:  *FEVER GREATER THAN 100.5 F  *CHILLS WITH OR WITHOUT FEVER  NAUSEA AND VOMITING THAT IS NOT CONTROLLED WITH YOUR NAUSEA MEDICATION  *UNUSUAL SHORTNESS OF BREATH  *UNUSUAL BRUISING OR BLEEDING  TENDERNESS IN MOUTH AND THROAT WITH OR WITHOUT PRESENCE OF ULCERS  *URINARY PROBLEMS  *BOWEL PROBLEMS  UNUSUAL RASH Items with * indicate a potential emergency and should be followed up as soon as possible.  Feel free to call the clinic you have any questions or concerns. The clinic phone number is [phone].  Please show the CHEMO ALERT CARD at check-in to the Emergency Department and triage nurse.   

## 2015-05-28 NOTE — Progress Notes (Signed)
[facility] [phone]   [phone]  OFFICE PROGRESS NOTE  [name], MD [place] [address]  DIAGNOSIS: Stage IV (T2a, N2, M1b) lung cancer, probably non-small cell carcinoma, adenocarcinoma diagnosed in February 2017 presented with right upper lobe lung mass in addition to mediastinal lymphadenopathy.  PRIOR THERAPY: None  CURRENT THERAPY: Systemic chemotherapy with carboplatin for AUC of 5 and Alimta 500 MG/M2 every 3 weeks. First dose 04/16/2015  INTERVAL HISTORY: Derek Howard 75 y.o. male returns to the clinic today for follow-up visit accompanied by his wife and a family friend. He is feeling fine today was no specific complaints except for muscle spasm and back pain. He received his muscle relaxant and pain medication from his primary care physician. He denied having any significant chest pain but continues to have shortness breath with exertion. He has no cough or hemoptysis He has no significant weight loss. Has soreness throat recently with low-grade fever. The patient underwent several studies recently including PET scan as well as MRI of the brain. He also underwent electromagnetic navigational bronchoscopy with needle aspiration, brushing and biopsy as well as endobronchial ultrasound with mediastinal lymph node aspirations under the care of Dr. Roxan Hockey on 03/27/2015. The final pathology (Accession: [id number]) was consistent with adenocarcinoma of lung primary. There was insufficient material for molecular studies.  MEDICAL HISTORY: Past Medical History  Diagnosis Date  . Hearing loss   . Lung mass 03/10/2015  . Alcohol abuse     stopped 2013  . Cancer ([facility] [facility])     basal cell carcinoma  . Hypertension     ALLERGIES:  has No Known Allergies.  MEDICATIONS:  Current Outpatient Prescriptions  Medication Sig Dispense Refill  . dexamethasone (DECADRON) 4 MG tablet 4 mg by mouth twice a day the day before,  day of and day after chemotherapy every 3 weeks 40 tablet 0  . cyclobenzaprine (FLEXERIL) 10 MG tablet Take 1 tablet (10 mg total) by mouth 3 (three) times daily as needed for muscle spasms. (Patient not taking: Reported on 05/28/2015) 30 tablet 0  . DULoxetine (CYMBALTA) 20 MG capsule Take 1 capsule (20 mg total) by mouth daily. (Patient not taking: Reported on 05/28/2015) 30 capsule 4  . folic acid (FOLVITE) 1 MG tablet Take 1 tablet (1 mg total) by mouth daily. (Patient not taking: Reported on 05/28/2015) 30 tablet 4  . hydrochlorothiazide (MICROZIDE) 12.5 MG capsule Take 1 capsule (12.5 mg total) by mouth daily. (Patient not taking: Reported on 05/28/2015) 30 capsule 4  . prochlorperazine (COMPAZINE) 10 MG tablet Take 1 tablet (10 mg total) by mouth every 6 (six) hours as needed for nausea or vomiting. (Patient not taking: Reported on 05/28/2015) 30 tablet 0  . traMADol-acetaminophen (ULTRACET) 37.5-325 MG tablet Take 1 tablet by mouth every 8 (eight) hours as needed. (Patient not taking: Reported on 05/28/2015) 90 tablet 0   No current facility-administered medications for this visit.    SURGICAL HISTORY:  Past Surgical History  Procedure Laterality Date  . Hernia repair    . Video bronchoscopy with endobronchial navigation N/A 03/27/2015    Procedure: VIDEO BRONCHOSCOPY WITH ENDOBRONCHIAL NAVIGATION;  Surgeon: Melrose Nakayama, MD;  Location: [facility];  Service: Thoracic;  Laterality: N/A;  . Video bronchoscopy with endobronchial ultrasound N/A 03/27/2015    Procedure: VIDEO BRONCHOSCOPY WITH ENDOBRONCHIAL ULTRASOUND;  Surgeon: Melrose Nakayama, MD;  Location: [facility];  Service: Thoracic;  Laterality: N/A;  REVIEW OF SYSTEMS:  Constitutional: positive for fatigue Eyes: negative Ears, nose, mouth, throat, and face: negative Respiratory: positive for dyspnea on exertion Cardiovascular: negative Gastrointestinal: negative Genitourinary:negative Integument/breast: negative Hematologic/lymphatic:  negative Musculoskeletal:positive for myalgias Neurological: negative Behavioral/Psych: negative Endocrine: negative Allergic/Immunologic: negative   PHYSICAL EXAMINATION: General appearance: alert, cooperative, fatigued and no distress Head: Normocephalic, without obvious abnormality, atraumatic Neck: no adenopathy, no JVD, supple, symmetrical, trachea midline and thyroid not enlarged, symmetric, no tenderness/mass/nodules Lymph nodes: Cervical, supraclavicular, and axillary nodes normal. Resp: clear to auscultation bilaterally Back: symmetric, no curvature. ROM normal. No CVA tenderness. Cardio: regular rate and rhythm, S1, S2 normal, no murmur, click, rub or gallop GI: soft, non-tender; bowel sounds normal; no masses,  no organomegaly Extremities: extremities normal, atraumatic, no cyanosis or edema Neurologic: Alert and oriented X 3, normal strength and tone. Normal symmetric reflexes. Normal coordination and gait  ECOG PERFORMANCE STATUS: 1 - Symptomatic but completely ambulatory  Blood pressure 160/66, pulse 99, temperature 98.5 F (36.9 C), temperature source Oral, resp. rate 19, height '5\' 6"'$  (1.676 m), weight 159 lb 6.4 oz (72.303 kg), SpO2 99 %.  LABORATORY DATA: Lab Results  Component Value Date   WBC 11.1* 05/28/2015   HGB 11.6* 05/28/2015   HCT 36.1* 05/28/2015   MCV 83.0 05/28/2015   PLT 507* 05/28/2015      Chemistry      Component Value Date/Time   NA 142 05/07/2015 1341   NA 140 03/27/2015 0721   K 4.6 05/07/2015 1341   K 3.2* 03/27/2015 0721   CL 101 03/27/2015 0721   CO2 22 05/07/2015 1341   CO2 25 03/27/2015 0721   BUN 23.4 05/07/2015 1341   BUN 19 03/27/2015 0721   CREATININE 0.8 05/07/2015 1341   CREATININE 0.98 03/27/2015 0721   CREATININE 0.82 02/23/2015 2004      Component Value Date/Time   CALCIUM 9.4 05/07/2015 1341   CALCIUM 8.9 03/27/2015 0721   ALKPHOS 63 05/07/2015 1341   ALKPHOS 54 03/27/2015 0721   AST 12 05/07/2015 1341   AST 21  03/27/2015 0721   ALT 18 05/07/2015 1341   ALT 21 03/27/2015 0721   BILITOT <0.30 05/07/2015 1341   BILITOT 0.5 03/27/2015 0721       RADIOGRAPHIC STUDIES: No results found.  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 He is currently undergoing systemic chemotherapy with carboplatin and Alimta status post 2 cycles. He is tolerating his treatment well with no significant adverse effects. He denied having any fever or chills. He has no nausea or vomiting. I recommended for the patient to proceed with cycle #3 today as a scheduled. The patient would come back for follow-up visit in 3 weeks for reevaluation with repeat CT scan of the chest, abdomen and pelvis for restaging of his disease. He was advised to call if he has any concerning symptoms in the interval. The patient voices understanding of current disease status and treatment options and is in agreement with the current care plan.  All questions were answered. The patient knows to call the clinic with any problems, questions or concerns. We can certainly see the patient much sooner if necessary.  Disclaimer: This note was dictated with voice recognition software. Similar sounding words can inadvertently be transcribed and  may not be corrected upon review.

## 2015-06-04 ENCOUNTER — Other Ambulatory Visit: Payer: Commercial Managed Care - HMO

## 2015-06-11 ENCOUNTER — Other Ambulatory Visit: Payer: Commercial Managed Care - HMO

## 2015-06-18 ENCOUNTER — Ambulatory Visit: Payer: Commercial Managed Care - HMO

## 2015-06-18 ENCOUNTER — Encounter: Payer: Self-pay | Admitting: Internal Medicine

## 2015-06-18 ENCOUNTER — Ambulatory Visit (HOSPITAL_BASED_OUTPATIENT_CLINIC_OR_DEPARTMENT_OTHER): Payer: Commercial Managed Care - HMO | Admitting: Internal Medicine

## 2015-06-18 ENCOUNTER — Telehealth: Payer: Self-pay | Admitting: Internal Medicine

## 2015-06-18 ENCOUNTER — Other Ambulatory Visit (HOSPITAL_BASED_OUTPATIENT_CLINIC_OR_DEPARTMENT_OTHER): Payer: Commercial Managed Care - HMO

## 2015-06-18 VITALS — BP 171/70 | HR 99 | Temp 98.4°F | Resp 18 | Ht 66.0 in | Wt 160.7 lb

## 2015-06-18 DIAGNOSIS — C3411 Malignant neoplasm of upper lobe, right bronchus or lung: Secondary | ICD-10-CM | POA: Diagnosis not present

## 2015-06-18 DIAGNOSIS — C7951 Secondary malignant neoplasm of bone: Secondary | ICD-10-CM

## 2015-06-18 DIAGNOSIS — C3491 Malignant neoplasm of unspecified part of right bronchus or lung: Secondary | ICD-10-CM

## 2015-06-18 DIAGNOSIS — Z5111 Encounter for antineoplastic chemotherapy: Secondary | ICD-10-CM

## 2015-06-18 LAB — COMPREHENSIVE METABOLIC PANEL
ALT: 18 U/L (ref 0–55)
AST: 14 U/L (ref 5–34)
Albumin: 3.4 g/dL — ABNORMAL LOW (ref 3.5–5.0)
Alkaline Phosphatase: 56 U/L (ref 40–150)
Anion Gap: 9 mEq/L (ref 3–11)
BUN: 21.7 mg/dL (ref 7.0–26.0)
CHLORIDE: 110 meq/L — AB (ref 98–109)
CO2: 22 meq/L (ref 22–29)
CREATININE: 0.8 mg/dL (ref 0.7–1.3)
Calcium: 9.2 mg/dL (ref 8.4–10.4)
EGFR: 86 mL/min/{1.73_m2} — ABNORMAL LOW (ref >90)
GLUCOSE: 160 mg/dL — AB (ref 70–140)
Potassium: 4.1 mEq/L (ref 3.5–5.1)
SODIUM: 141 meq/L (ref 136–145)
TOTAL PROTEIN: 7 g/dL (ref 6.4–8.3)

## 2015-06-18 LAB — CBC WITH DIFFERENTIAL/PLATELET
BASO%: 0.8 % (ref 0.0–2.0)
Basophils Absolute: 0.1 10*3/uL (ref 0.0–0.1)
EOS%: 0 % (ref 0.0–7.0)
Eosinophils Absolute: 0 10*3/uL (ref 0.0–0.5)
HCT: 35 % — ABNORMAL LOW (ref 38.4–49.9)
HGB: 11.5 g/dL — ABNORMAL LOW (ref 13.0–17.1)
LYMPH%: 12 % — AB (ref 14.0–49.0)
MCH: 27.1 pg — ABNORMAL LOW (ref 27.2–33.4)
MCHC: 32.8 g/dL (ref 32.0–36.0)
MCV: 82.6 fL (ref 79.3–98.0)
MONO#: 0.6 10*3/uL (ref 0.1–0.9)
MONO%: 7.5 % (ref 0.0–14.0)
NEUT%: 79.7 % — AB (ref 39.0–75.0)
NEUTROS ABS: 6.2 10*3/uL (ref 1.5–6.5)
Platelets: 411 10*3/uL — ABNORMAL HIGH (ref 140–400)
RBC: 4.23 10*6/uL (ref 4.20–5.82)
RDW: 18.2 % — ABNORMAL HIGH (ref 11.0–14.6)
WBC: 7.8 10*3/uL (ref 4.0–10.3)
lymph#: 0.9 10*3/uL (ref 0.9–3.3)

## 2015-06-18 NOTE — Progress Notes (Signed)
[facility] [phone]   [phone]  OFFICE PROGRESS NOTE  [name], MD [place] [address]  DIAGNOSIS: Stage IV (T2a, N2, M1b) lung cancer, probably non-small cell carcinoma, adenocarcinoma diagnosed in February 2017 presented with right upper lobe lung mass in addition to mediastinal lymphadenopathy.  PRIOR THERAPY: None  CURRENT THERAPY: Systemic chemotherapy with carboplatin for AUC of 5 and Alimta 500 MG/M2 every 3 weeks. First dose 04/16/2015. Status post 3 cycles.  INTERVAL HISTORY: Derek Howard 75 y.o. male returns to the clinic today for follow-up visit accompanied by his wife. The patient is tolerating his systemic chemotherapy was carboplatin and Alimta fairly well with no significant adverse effects. He continues to have intermittent to spasm in the chest. He denied having any significant shortness of breath but has mild cough with no hemoptysis. He has no fever or chills. He has no nausea or vomiting. He denied having any significant weight loss or night sweats. The patient was supposed to have repeat CT scan of the chest, abdomen and pelvis on 06/16/2015 before his visit today for evaluation of his disease but he missed his scan appointment. He was supposed to start cycle #4 today.  MEDICAL HISTORY: Past Medical History  Diagnosis Date  . Hearing loss   . Lung mass 03/10/2015  . Alcohol abuse     stopped 2013  . Cancer ([place])     basal cell carcinoma  . Hypertension   . Encounter for antineoplastic chemotherapy 05/28/2015    ALLERGIES:  has No Known Allergies.  MEDICATIONS:  Current Outpatient Prescriptions  Medication Sig Dispense Refill  . dexamethasone (DECADRON) 4 MG tablet 4 mg by mouth twice a day the day before, day of and day after chemotherapy every 3 weeks 40 tablet 0  . folic acid (FOLVITE) 1 MG tablet Take 1 tablet (1 mg total) by mouth daily. 30 tablet 4  . hydrochlorothiazide  (MICROZIDE) 12.5 MG capsule Take 1 capsule (12.5 mg total) by mouth daily. (Patient not taking: Reported on 05/28/2015) 30 capsule 4  . prochlorperazine (COMPAZINE) 10 MG tablet Take 1 tablet (10 mg total) by mouth every 6 (six) hours as needed for nausea or vomiting. (Patient not taking: Reported on 05/28/2015) 30 tablet 0   No current facility-administered medications for this visit.    SURGICAL HISTORY:  Past Surgical History  Procedure Laterality Date  . Hernia repair    . Video bronchoscopy with endobronchial navigation N/A 03/27/2015    Procedure: VIDEO BRONCHOSCOPY WITH ENDOBRONCHIAL NAVIGATION;  Surgeon: Melrose Nakayama, MD;  Location: [facility];  Service: Thoracic;  Laterality: N/A;  . Video bronchoscopy with endobronchial ultrasound N/A 03/27/2015    Procedure: VIDEO BRONCHOSCOPY WITH ENDOBRONCHIAL ULTRASOUND;  Surgeon: Melrose Nakayama, MD;  Location: [facility];  Service: Thoracic;  Laterality: N/A;    REVIEW OF SYSTEMS:  A comprehensive review of systems was negative except for: Constitutional: positive for fatigue Musculoskeletal: positive for myalgias   PHYSICAL EXAMINATION: General appearance: alert, cooperative, fatigued and no distress Head: Normocephalic, without obvious abnormality, atraumatic Neck: no adenopathy, no JVD, supple, symmetrical, trachea midline and thyroid not enlarged, symmetric, no tenderness/mass/nodules Lymph nodes: Cervical, supraclavicular, and axillary nodes normal. Resp: clear to auscultation bilaterally Back: symmetric, no curvature. ROM normal. No CVA tenderness. Cardio: regular rate and rhythm, S1, S2 normal, no murmur, click, rub or gallop GI: soft, non-tender; bowel sounds normal; no masses,  no organomegaly Extremities: extremities normal, atraumatic, no  cyanosis or edema Neurologic: Alert and oriented X 3, normal strength and tone. Normal symmetric reflexes. Normal coordination and gait  ECOG PERFORMANCE STATUS: 1 - Symptomatic but completely  ambulatory  Blood pressure 171/70, pulse 99, temperature 98.4 F (36.9 C), temperature source Oral, resp. rate 18, height '5\' 6"'$  (1.676 m), weight 160 lb 11.2 oz (72.893 kg), SpO2 98 %.  LABORATORY DATA: Lab Results  Component Value Date   WBC 7.8 06/18/2015   HGB 11.5* 06/18/2015   HCT 35.0* 06/18/2015   MCV 82.6 06/18/2015   PLT 411* 06/18/2015      Chemistry      Component Value Date/Time   NA 141 06/18/2015 1129   NA 140 03/27/2015 0721   K 4.1 06/18/2015 1129   K 3.2* 03/27/2015 0721   CL 101 03/27/2015 0721   CO2 22 06/18/2015 1129   CO2 25 03/27/2015 0721   BUN 21.7 06/18/2015 1129   BUN 19 03/27/2015 0721   CREATININE 0.8 06/18/2015 1129   CREATININE 0.98 03/27/2015 0721   CREATININE 0.82 02/23/2015 2004      Component Value Date/Time   CALCIUM 9.2 06/18/2015 1129   CALCIUM 8.9 03/27/2015 0721   ALKPHOS 56 06/18/2015 1129   ALKPHOS 54 03/27/2015 0721   AST 14 06/18/2015 1129   AST 21 03/27/2015 0721   ALT 18 06/18/2015 1129   ALT 21 03/27/2015 0721   BILITOT <0.30 06/18/2015 1129   BILITOT 0.5 03/27/2015 0721       RADIOGRAPHIC STUDIES: No results found.  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 He is currently undergoing systemic chemotherapy with carboplatin and Alimta status post 3 cycles. He is tolerating his treatment well with no significant adverse effects.  He missed his restaging scan appointment. I will arrange for the patient to have repeat CT scan of the chest, abdomen and pelvis in the next few days. I will delay the start of cycle #4 until after the scan results. He would come back for follow-up visit next week for evaluation and discussion of his scan results and further  recommendation regarding treatment of his condition. He was advised to call if he has any concerning symptoms in the interval. The patient voices understanding of current disease status and treatment options and is in agreement with the current care plan.  All questions were answered. The patient knows to call the clinic with any problems, questions or concerns. We can certainly see the patient much sooner if necessary.  Disclaimer: This note was dictated with voice recognition software. Similar sounding words can inadvertently be transcribed and may not be corrected upon review.

## 2015-06-18 NOTE — Telephone Encounter (Signed)
per pof to sch pt appt-sent [facility] email ot sch trmt-gave contrast-sent [place] email to [place] [facility] for 5/1-gave copy of avs

## 2015-06-22 ENCOUNTER — Ambulatory Visit (HOSPITAL_COMMUNITY)
Admission: RE | Admit: 2015-06-22 | Discharge: 2015-06-22 | Disposition: A | Discharge status: Home / Self Care | Payer: Commercial Managed Care - HMO | Source: Ambulatory Visit | Attending: Internal Medicine | Admitting: Internal Medicine

## 2015-06-22 ENCOUNTER — Encounter (HOSPITAL_COMMUNITY): Payer: Self-pay

## 2015-06-22 DIAGNOSIS — C3491 Malignant neoplasm of unspecified part of right bronchus or lung: Secondary | ICD-10-CM | POA: Diagnosis not present

## 2015-06-22 DIAGNOSIS — N400 Enlarged prostate without lower urinary tract symptoms: Secondary | ICD-10-CM | POA: Insufficient documentation

## 2015-06-22 DIAGNOSIS — I2510 Atherosclerotic heart disease of native coronary artery without angina pectoris: Secondary | ICD-10-CM | POA: Diagnosis not present

## 2015-06-22 DIAGNOSIS — R918 Other nonspecific abnormal finding of lung field: Secondary | ICD-10-CM | POA: Diagnosis not present

## 2015-06-22 DIAGNOSIS — D3502 Benign neoplasm of left adrenal gland: Secondary | ICD-10-CM | POA: Insufficient documentation

## 2015-06-22 DIAGNOSIS — R590 Localized enlarged lymph nodes: Secondary | ICD-10-CM | POA: Insufficient documentation

## 2015-06-22 DIAGNOSIS — Z9221 Personal history of antineoplastic chemotherapy: Secondary | ICD-10-CM | POA: Insufficient documentation

## 2015-06-22 DIAGNOSIS — M899 Disorder of bone, unspecified: Secondary | ICD-10-CM | POA: Diagnosis not present

## 2015-06-22 DIAGNOSIS — N200 Calculus of kidney: Secondary | ICD-10-CM | POA: Diagnosis not present

## 2015-06-22 DIAGNOSIS — K449 Diaphragmatic hernia without obstruction or gangrene: Secondary | ICD-10-CM | POA: Insufficient documentation

## 2015-06-22 DIAGNOSIS — Z5111 Encounter for antineoplastic chemotherapy: Secondary | ICD-10-CM

## 2015-06-22 MED ORDER — IOPAMIDOL (ISOVUE-300) INJECTION 61%
100.0000 mL | Freq: Once | INTRAVENOUS | Status: AC | PRN
  Administered 2015-06-22: 100 mL via INTRAVENOUS

## 2015-06-25 ENCOUNTER — Other Ambulatory Visit (HOSPITAL_BASED_OUTPATIENT_CLINIC_OR_DEPARTMENT_OTHER): Payer: Commercial Managed Care - HMO

## 2015-06-25 ENCOUNTER — Encounter: Payer: Self-pay | Admitting: Internal Medicine

## 2015-06-25 ENCOUNTER — Ambulatory Visit (HOSPITAL_BASED_OUTPATIENT_CLINIC_OR_DEPARTMENT_OTHER): Payer: Commercial Managed Care - HMO

## 2015-06-25 ENCOUNTER — Ambulatory Visit (HOSPITAL_BASED_OUTPATIENT_CLINIC_OR_DEPARTMENT_OTHER): Payer: Commercial Managed Care - HMO | Admitting: Internal Medicine

## 2015-06-25 VITALS — BP 160/72 | HR 92 | Temp 98.3°F | Resp 18 | Ht 66.0 in | Wt 155.0 lb

## 2015-06-25 DIAGNOSIS — G8929 Other chronic pain: Secondary | ICD-10-CM

## 2015-06-25 DIAGNOSIS — C3411 Malignant neoplasm of upper lobe, right bronchus or lung: Secondary | ICD-10-CM

## 2015-06-25 DIAGNOSIS — C7951 Secondary malignant neoplasm of bone: Secondary | ICD-10-CM

## 2015-06-25 DIAGNOSIS — Z5111 Encounter for antineoplastic chemotherapy: Secondary | ICD-10-CM

## 2015-06-25 DIAGNOSIS — C3491 Malignant neoplasm of unspecified part of right bronchus or lung: Secondary | ICD-10-CM

## 2015-06-25 DIAGNOSIS — M545 Low back pain, unspecified: Secondary | ICD-10-CM

## 2015-06-25 LAB — COMPREHENSIVE METABOLIC PANEL
ALBUMIN: 3.4 g/dL — AB (ref 3.5–5.0)
ALK PHOS: 55 U/L (ref 40–150)
ALT: 14 U/L (ref 0–55)
ANION GAP: 13 meq/L — AB (ref 3–11)
AST: 17 U/L (ref 5–34)
BUN: 25.8 mg/dL (ref 7.0–26.0)
CALCIUM: 9.6 mg/dL (ref 8.4–10.4)
CHLORIDE: 105 meq/L (ref 98–109)
CO2: 23 mEq/L (ref 22–29)
Creatinine: 0.9 mg/dL (ref 0.7–1.3)
EGFR: 83 mL/min/{1.73_m2} — ABNORMAL LOW (ref >90)
Glucose: 167 mg/dl — ABNORMAL HIGH (ref 70–140)
POTASSIUM: 3.5 meq/L (ref 3.5–5.1)
Sodium: 142 mEq/L (ref 136–145)
Total Bilirubin: 0.36 mg/dL (ref 0.20–1.20)
Total Protein: 7.3 g/dL (ref 6.4–8.3)

## 2015-06-25 LAB — CBC WITH DIFFERENTIAL/PLATELET
BASO%: 0.6 % (ref 0.0–2.0)
BASOS ABS: 0.1 10*3/uL (ref 0.0–0.1)
EOS ABS: 0 10*3/uL (ref 0.0–0.5)
EOS%: 0.1 % (ref 0.0–7.0)
HEMATOCRIT: 37.5 % — AB (ref 38.4–49.9)
HEMOGLOBIN: 12.1 g/dL — AB (ref 13.0–17.1)
LYMPH#: 1.2 10*3/uL (ref 0.9–3.3)
LYMPH%: 7.5 % — ABNORMAL LOW (ref 14.0–49.0)
MCH: 26.9 pg — AB (ref 27.2–33.4)
MCHC: 32.4 g/dL (ref 32.0–36.0)
MCV: 83 fL (ref 79.3–98.0)
MONO#: 0.6 10*3/uL (ref 0.1–0.9)
MONO%: 4.1 % (ref 0.0–14.0)
NEUT#: 13.6 10*3/uL — ABNORMAL HIGH (ref 1.5–6.5)
NEUT%: 87.7 % — AB (ref 39.0–75.0)
PLATELETS: 288 10*3/uL (ref 140–400)
RBC: 4.52 10*6/uL (ref 4.20–5.82)
RDW: 18.3 % — AB (ref 11.0–14.6)
WBC: 15.5 10*3/uL — ABNORMAL HIGH (ref 4.0–10.3)

## 2015-06-25 MED ORDER — SODIUM CHLORIDE 0.9 % IV SOLN
446.0000 mg | Freq: Once | INTRAVENOUS | Status: AC
  Administered 2015-06-25: 450 mg via INTRAVENOUS
  Filled 2015-06-25: qty 45

## 2015-06-25 MED ORDER — PALONOSETRON HCL INJECTION 0.25 MG/5ML
INTRAVENOUS | Status: AC
  Filled 2015-06-25: qty 5

## 2015-06-25 MED ORDER — HEPARIN SOD (PORK) LOCK FLUSH 100 UNIT/ML IV SOLN
500.0000 [IU] | Freq: Once | INTRAVENOUS | Status: DC | PRN
  Filled 2015-06-25: qty 5

## 2015-06-25 MED ORDER — PALONOSETRON HCL INJECTION 0.25 MG/5ML
0.2500 mg | Freq: Once | INTRAVENOUS | Status: AC
  Administered 2015-06-25: 0.25 mg via INTRAVENOUS

## 2015-06-25 MED ORDER — SODIUM CHLORIDE 0.9% FLUSH
10.0000 mL | INTRAVENOUS | Status: DC | PRN
  Filled 2015-06-25: qty 10

## 2015-06-25 MED ORDER — SODIUM CHLORIDE 0.9 % IV SOLN
Freq: Once | INTRAVENOUS | Status: AC
  Administered 2015-06-25: 13:00:00 via INTRAVENOUS

## 2015-06-25 MED ORDER — SODIUM CHLORIDE 0.9 % IV SOLN
10.0000 mg | Freq: Once | INTRAVENOUS | Status: AC
  Administered 2015-06-25: 10 mg via INTRAVENOUS
  Filled 2015-06-25: qty 1

## 2015-06-25 MED ORDER — SODIUM CHLORIDE 0.9 % IV SOLN
490.0000 mg/m2 | Freq: Once | INTRAVENOUS | Status: AC
  Administered 2015-06-25: 900 mg via INTRAVENOUS
  Filled 2015-06-25: qty 36

## 2015-06-25 NOTE — Progress Notes (Signed)
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  PRIOR THERAPY: None  CURRENT THERAPY: Systemic chemotherapy with carboplatin for AUC of 5 and Alimta 500 MG/M2 every 3 weeks. First dose 04/16/2015. Status post 3 cycles.  INTERVAL HISTORY: Derek Howard 75 y.o. male returns to the clinic today for follow-up visit accompanied by his wife. The patient is tolerating his systemic chemotherapy with carboplatin and Alimta fairly well with no significant adverse effects. He denied having any significant chest pain, shortness of breath but has mild cough with no hemoptysis. He has no fever or chills. He has no nausea or vomiting. He denied having any significant weight loss or night sweats. He had repeat CT scan of the chest, abdomen and pelvis performed recently and he is here for evaluation and discussion of his scan results.  MEDICAL HISTORY: Past Medical History  Diagnosis Date  . Hearing loss   . Lung mass 03/10/2015  . Alcohol abuse     stopped 2013  . Cancer ([facility])     basal cell carcinoma  . Hypertension   . Encounter for antineoplastic chemotherapy 05/28/2015    ALLERGIES:  has No Known Allergies.  MEDICATIONS:  Current Outpatient Prescriptions  Medication Sig Dispense Refill  . dexamethasone (DECADRON) 4 MG tablet 4 mg by mouth twice a day the day before, day of and day after chemotherapy every 3 weeks 40 tablet 0  . folic acid (FOLVITE) 1 MG tablet Take 1 tablet (1 mg total) by mouth daily. 30 tablet 4  . hydrochlorothiazide (MICROZIDE) 12.5 MG capsule Take 1 capsule (12.5 mg total) by mouth daily. 30 capsule 4  . prochlorperazine (COMPAZINE) 10 MG tablet Take 1  tablet (10 mg total) by mouth every 6 (six) hours as needed for nausea or vomiting. 30 tablet 0   No current facility-administered medications for this visit.    SURGICAL HISTORY:  Past Surgical History  Procedure Laterality Date  . Hernia repair    . Video bronchoscopy with endobronchial navigation N/A 03/27/2015    Procedure: VIDEO BRONCHOSCOPY WITH ENDOBRONCHIAL NAVIGATION;  Surgeon: Melrose Nakayama, MD;  Location: [facility];  Service: Thoracic;  Laterality: N/A;  . Video bronchoscopy with endobronchial ultrasound N/A 03/27/2015    Procedure: VIDEO BRONCHOSCOPY WITH ENDOBRONCHIAL ULTRASOUND;  Surgeon: Melrose Nakayama, MD;  Location: [facility];  Service: Thoracic;  Laterality: N/A;    REVIEW OF SYSTEMS:  Constitutional: positive for fatigue Eyes: negative Ears, nose, mouth, throat, and face: negative Respiratory: positive for dyspnea on exertion Cardiovascular: negative Gastrointestinal: negative Genitourinary:negative Integument/breast: negative Hematologic/lymphatic: negative Musculoskeletal:negative Neurological: negative Behavioral/Psych: negative Endocrine: negative Allergic/Immunologic: negative   PHYSICAL EXAMINATION: General appearance: alert, cooperative, fatigued and no distress Head: Normocephalic, without obvious abnormality, atraumatic Neck: no adenopathy, no JVD, supple, symmetrical, trachea midline and thyroid not enlarged, symmetric, no tenderness/mass/nodules Lymph nodes: Cervical, supraclavicular, and axillary nodes normal. Resp: clear to auscultation bilaterally Back: symmetric, no curvature. ROM normal. No CVA tenderness. Cardio: regular rate and rhythm, S1, S2 normal, no murmur, click, rub or gallop GI: soft, non-tender; bowel sounds normal; no masses,  no organomegaly Extremities: extremities normal, atraumatic, no cyanosis or edema Neurologic: Alert and oriented X 3, normal strength and tone. Normal symmetric reflexes. Normal  coordination and gait  ECOG  PERFORMANCE STATUS: 1 - Symptomatic but completely ambulatory  Blood pressure 160/72, pulse 92, temperature 98.3 F (36.8 C), temperature source Oral, resp. rate 18, height '5\' 6"'$  (1.676 m), weight 155 lb (70.308 kg), SpO2 98 %.  LABORATORY DATA: Lab Results  Component Value Date   WBC 15.5* 06/25/2015   HGB 12.1* 06/25/2015   HCT 37.5* 06/25/2015   MCV 83.0 06/25/2015   PLT 288 06/25/2015      Chemistry      Component Value Date/Time   NA 142 06/25/2015 1110   NA 140 03/27/2015 0721   K 3.5 06/25/2015 1110   K 3.2* 03/27/2015 0721   CL 101 03/27/2015 0721   CO2 23 06/25/2015 1110   CO2 25 03/27/2015 0721   BUN 25.8 06/25/2015 1110   BUN 19 03/27/2015 0721   CREATININE 0.9 06/25/2015 1110   CREATININE 0.98 03/27/2015 0721   CREATININE 0.82 02/23/2015 2004      Component Value Date/Time   CALCIUM 9.6 06/25/2015 1110   CALCIUM 8.9 03/27/2015 0721   ALKPHOS 55 06/25/2015 1110   ALKPHOS 54 03/27/2015 0721   AST 17 06/25/2015 1110   AST 21 03/27/2015 0721   ALT 14 06/25/2015 1110   ALT 21 03/27/2015 0721   BILITOT 0.36 06/25/2015 1110   BILITOT 0.5 03/27/2015 0721       RADIOGRAPHIC STUDIES: Ct Chest W Contrast  06/22/2015  CLINICAL DATA:  Right lung cancer, chemotherapy in progress. EXAM: CT CHEST, ABDOMEN, AND PELVIS WITH CONTRAST TECHNIQUE: Multidetector CT imaging of the chest, abdomen and pelvis was performed following the standard protocol during bolus administration of intravenous contrast. CONTRAST:  152m ISOVUE-300 IOPAMIDOL (ISOVUE-300) INJECTION 61% COMPARISON:  Sweats 03/26/2015 and CT chest 03/05/2015. FINDINGS: CT CHEST FINDINGS Mediastinum/Lymph Nodes: Mediastinal lymph nodes measure up to 1.2 cm in the lower right paratracheal station, previously 1.5 cm. No hilar or axillary adenopathy. Atherosclerotic calcification of the arterial vasculature, including coronary arteries. Heart size normal. No pericardial effusion. Large hiatal hernia. Lungs/Pleura:  Centrilobular emphysema. Spiculated right upper lobe mass measures 3.1 x 3.7 cm, stable. Scarring in the lingula no pleural fluid. Airway is unremarkable. Musculoskeletal: A lytic lesion in the posterior aspect of the left sixth rib measures 3.1 cm, previously 2.6 cm. CT ABDOMEN PELVIS FINDINGS Hepatobiliary: Numerous low-attenuation lesions are seen in the liver, measuring up to 2.3 cm, stable. Liver and gallbladder are otherwise unremarkable. No biliary ductal dilatation. Pancreas: Negative. Spleen: Negative. Adrenals/Urinary Tract: Right adrenal gland is unremarkable. Left adrenal lesion measures 2.0 cm, stable and shown to be fluid in density on 03/26/2015. Low-attenuation lesions in the kidneys measure up to 3.9 cm to on the left and are likely cysts although definitive characterization is limited without precontrast imaging and/or due to size. Tiny stone in the left kidney. Ureters are decompressed. Bladder is low in volume. Stomach/Bowel: Large hiatal hernia. Stomach, small bowel, appendix and colon are otherwise unremarkable. Vascular/Lymphatic: Atherosclerotic calcification of the arterial vasculature without abdominal aortic aneurysm. No pathologically enlarged lymph nodes. Reproductive: Prostate is mildly enlarged. Other: Small bilateral inguinal hernias contain fat. No free fluid. Mesenteries and peritoneum are unremarkable. Musculoskeletal: No additional worrisome lytic or sclerotic lesions. Degenerative changes in the left hip. IMPRESSION: 1. Stable right upper lobe mass with slight decrease in size of mediastinal lymph nodes. 2. Lytic lesion in the posterior left sixth rib appears slightly larger. 3. Coronary artery calcification. 4. Left adrenal adenoma. 5. Tiny left renal stone. 6. Large hiatal hernia  7. Enlarged prostate. Electronically Signed   By: Lorin Picket M.D.   On: 06/22/2015 14:47   Ct Abdomen Pelvis W Contrast  06/22/2015  CLINICAL DATA:  Right lung cancer, chemotherapy in progress.  EXAM: CT CHEST, ABDOMEN, AND PELVIS WITH CONTRAST TECHNIQUE: Multidetector CT imaging of the chest, abdomen and pelvis was performed following the standard protocol during bolus administration of intravenous contrast. CONTRAST:  111m ISOVUE-300 IOPAMIDOL (ISOVUE-300) INJECTION 61% COMPARISON:  Sweats 03/26/2015 and CT chest 03/05/2015. FINDINGS: CT CHEST FINDINGS Mediastinum/Lymph Nodes: Mediastinal lymph nodes measure up to 1.2 cm in the lower right paratracheal station, previously 1.5 cm. No hilar or axillary adenopathy. Atherosclerotic calcification of the arterial vasculature, including coronary arteries. Heart size normal. No pericardial effusion. Large hiatal hernia. Lungs/Pleura: Centrilobular emphysema. Spiculated right upper lobe mass measures 3.1 x 3.7 cm, stable. Scarring in the lingula no pleural fluid. Airway is unremarkable. Musculoskeletal: A lytic lesion in the posterior aspect of the left sixth rib measures 3.1 cm, previously 2.6 cm. CT ABDOMEN PELVIS FINDINGS Hepatobiliary: Numerous low-attenuation lesions are seen in the liver, measuring up to 2.3 cm, stable. Liver and gallbladder are otherwise unremarkable. No biliary ductal dilatation. Pancreas: Negative. Spleen: Negative. Adrenals/Urinary Tract: Right adrenal gland is unremarkable. Left adrenal lesion measures 2.0 cm, stable and shown to be fluid in density on 03/26/2015. Low-attenuation lesions in the kidneys measure up to 3.9 cm to on the left and are likely cysts although definitive characterization is limited without precontrast imaging and/or due to size. Tiny stone in the left kidney. Ureters are decompressed. Bladder is low in volume. Stomach/Bowel: Large hiatal hernia. Stomach, small bowel, appendix and colon are otherwise unremarkable. Vascular/Lymphatic: Atherosclerotic calcification of the arterial vasculature without abdominal aortic aneurysm. No pathologically enlarged lymph nodes. Reproductive: Prostate is mildly enlarged. Other:  Small bilateral inguinal hernias contain fat. No free fluid. Mesenteries and peritoneum are unremarkable. Musculoskeletal: No additional worrisome lytic or sclerotic lesions. Degenerative changes in the left hip. IMPRESSION: 1. Stable right upper lobe mass with slight decrease in size of mediastinal lymph nodes. 2. Lytic lesion in the posterior left sixth rib appears slightly larger. 3. Coronary artery calcification. 4. Left adrenal adenoma. 5. Tiny left renal stone. 6. Large hiatal hernia 7. Enlarged prostate. Electronically Signed   By: MLorin PicketM.D.   On: 06/22/2015 14:47    ASSESSMENT AND PLAN: This is a very pleasant 75years old white male recently diagnosed stage IV non-small cell lung cancer, adenocarcinoma presented with right upper lobe lung mass in addition to mediastinal lymphadenopathy and metastatic disease to the left posterior sixth rib diagnosed in February 2017. He is currently undergoing systemic chemotherapy with carboplatin and Alimta status post 3 cycles. He is tolerating his treatment well with no significant adverse effects.  The recent CT scan of the chest, abdomen and pelvis showed no concerning finding for disease progression. I discussed the scan results with the patient and his wife. I recommended for him to continue with systemic chemotherapy with carboplatin and Alimta. He will receive cycle #4 today. The patient would come back for follow-up visit in 3 weeks for evaluation with the start of cycle #5. He was advised to call if he has any concerning symptoms in the interval. The patient voices understanding of current disease status and treatment options and is in agreement with the current care plan.  All questions were answered. The patient knows to call the clinic with any problems, questions or concerns. We can certainly see the patient much sooner  if necessary.  Disclaimer: This note was dictated with voice recognition software. Similar sounding words can  inadvertently be transcribed and may not be corrected upon review.

## 2015-06-25 NOTE — Patient Instructions (Signed)
[facility] Discharge Instructions for Patients Receiving Chemotherapy  Today you received the following chemotherapy agents: Carboplatin, Alimta. To help prevent nausea and vomiting after your treatment, we encourage you to take your nausea medication. If you develop nausea and vomiting that is not controlled by your nausea medication, call the clinic.   BELOW ARE SYMPTOMS THAT SHOULD BE REPORTED IMMEDIATELY:  *FEVER GREATER THAN 100.5 F  *CHILLS WITH OR WITHOUT FEVER  NAUSEA AND VOMITING THAT IS NOT CONTROLLED WITH YOUR NAUSEA MEDICATION  *UNUSUAL SHORTNESS OF BREATH  *UNUSUAL BRUISING OR BLEEDING  TENDERNESS IN MOUTH AND THROAT WITH OR WITHOUT PRESENCE OF ULCERS  *URINARY PROBLEMS  *BOWEL PROBLEMS  UNUSUAL RASH Items with * indicate a potential emergency and should be followed up as soon as possible.  Feel free to call the clinic you have any questions or concerns. The clinic phone number is [phone].  Please show the [facility] at check-in to the Emergency Department and triage nurse.

## 2015-06-26 ENCOUNTER — Telehealth: Payer: Self-pay | Admitting: Internal Medicine

## 2015-06-26 ENCOUNTER — Telehealth: Payer: Self-pay | Admitting: *Deleted

## 2015-06-26 NOTE — Telephone Encounter (Signed)
per pof to sch pt appt-sent MW email to sch trnt-willc all pt after reply

## 2015-06-26 NOTE — Telephone Encounter (Signed)
Per staff message and POF I have scheduled appts. Advised scheduler of appts. JMW  

## 2015-06-29 ENCOUNTER — Telehealth: Payer: Self-pay | Admitting: Internal Medicine

## 2015-06-29 NOTE — Telephone Encounter (Signed)
per pof to sch pt appt-left pt a message of time * date of appt for 5/25'@11'$ :30

## 2015-07-16 ENCOUNTER — Ambulatory Visit (HOSPITAL_BASED_OUTPATIENT_CLINIC_OR_DEPARTMENT_OTHER): Payer: Commercial Managed Care - HMO | Admitting: Internal Medicine

## 2015-07-16 ENCOUNTER — Encounter: Payer: Self-pay | Admitting: *Deleted

## 2015-07-16 ENCOUNTER — Telehealth: Payer: Self-pay | Admitting: Internal Medicine

## 2015-07-16 ENCOUNTER — Ambulatory Visit (HOSPITAL_BASED_OUTPATIENT_CLINIC_OR_DEPARTMENT_OTHER): Payer: Commercial Managed Care - HMO

## 2015-07-16 ENCOUNTER — Encounter: Payer: Self-pay | Admitting: Internal Medicine

## 2015-07-16 ENCOUNTER — Telehealth: Payer: Self-pay | Admitting: Medical Oncology

## 2015-07-16 ENCOUNTER — Other Ambulatory Visit (HOSPITAL_BASED_OUTPATIENT_CLINIC_OR_DEPARTMENT_OTHER): Payer: Commercial Managed Care - HMO

## 2015-07-16 VITALS — BP 155/62 | HR 88 | Temp 98.4°F | Resp 18 | Ht 66.0 in | Wt 157.1 lb

## 2015-07-16 DIAGNOSIS — C3491 Malignant neoplasm of unspecified part of right bronchus or lung: Secondary | ICD-10-CM

## 2015-07-16 DIAGNOSIS — C7951 Secondary malignant neoplasm of bone: Secondary | ICD-10-CM

## 2015-07-16 DIAGNOSIS — Z5111 Encounter for antineoplastic chemotherapy: Secondary | ICD-10-CM

## 2015-07-16 DIAGNOSIS — C3411 Malignant neoplasm of upper lobe, right bronchus or lung: Secondary | ICD-10-CM

## 2015-07-16 LAB — CBC WITH DIFFERENTIAL/PLATELET
BASO%: 0.1 % (ref 0.0–2.0)
BASOS ABS: 0 10*3/uL (ref 0.0–0.1)
EOS ABS: 0 10*3/uL (ref 0.0–0.5)
EOS%: 0 % (ref 0.0–7.0)
HCT: 34.9 % — ABNORMAL LOW (ref 38.4–49.9)
HGB: 11.6 g/dL — ABNORMAL LOW (ref 13.0–17.1)
LYMPH%: 12 % — ABNORMAL LOW (ref 14.0–49.0)
MCH: 28.2 pg (ref 27.2–33.4)
MCHC: 33.2 g/dL (ref 32.0–36.0)
MCV: 84.9 fL (ref 79.3–98.0)
MONO#: 1.2 10*3/uL — ABNORMAL HIGH (ref 0.1–0.9)
MONO%: 11.2 % (ref 0.0–14.0)
NEUT%: 76.7 % — AB (ref 39.0–75.0)
NEUTROS ABS: 7.9 10*3/uL — AB (ref 1.5–6.5)
Platelets: 428 10*3/uL — ABNORMAL HIGH (ref 140–400)
RBC: 4.11 10*6/uL — AB (ref 4.20–5.82)
RDW: 17.4 % — ABNORMAL HIGH (ref 11.0–14.6)
WBC: 10.3 10*3/uL (ref 4.0–10.3)
lymph#: 1.2 10*3/uL (ref 0.9–3.3)

## 2015-07-16 LAB — COMPREHENSIVE METABOLIC PANEL
ALT: 18 U/L (ref 0–55)
AST: 14 U/L (ref 5–34)
Albumin: 3.5 g/dL (ref 3.5–5.0)
Alkaline Phosphatase: 53 U/L (ref 40–150)
Anion Gap: 11 mEq/L (ref 3–11)
BUN: 27.3 mg/dL — ABNORMAL HIGH (ref 7.0–26.0)
CHLORIDE: 108 meq/L (ref 98–109)
CO2: 23 meq/L (ref 22–29)
Calcium: 9.2 mg/dL (ref 8.4–10.4)
Creatinine: 0.9 mg/dL (ref 0.7–1.3)
EGFR: 80 mL/min/{1.73_m2} — AB (ref >90)
GLUCOSE: 175 mg/dL — AB (ref 70–140)
POTASSIUM: 3.6 meq/L (ref 3.5–5.1)
SODIUM: 141 meq/L (ref 136–145)
Total Bilirubin: <0.3 mg/dL (ref 0.20–1.20)
Total Protein: 7.3 g/dL (ref 6.4–8.3)

## 2015-07-16 MED ORDER — PALONOSETRON HCL INJECTION 0.25 MG/5ML
0.2500 mg | Freq: Once | INTRAVENOUS | Status: AC
  Administered 2015-07-16: 0.25 mg via INTRAVENOUS

## 2015-07-16 MED ORDER — SODIUM CHLORIDE 0.9 % IV SOLN
480.0000 mg/m2 | Freq: Once | INTRAVENOUS | Status: AC
  Administered 2015-07-16: 900 mg via INTRAVENOUS
  Filled 2015-07-16: qty 36

## 2015-07-16 MED ORDER — PALONOSETRON HCL INJECTION 0.25 MG/5ML
INTRAVENOUS | Status: AC
  Filled 2015-07-16: qty 5

## 2015-07-16 MED ORDER — SODIUM CHLORIDE 0.9 % IV SOLN
446.0000 mg | Freq: Once | INTRAVENOUS | Status: AC
  Administered 2015-07-16: 450 mg via INTRAVENOUS
  Filled 2015-07-16: qty 45

## 2015-07-16 MED ORDER — SODIUM CHLORIDE 0.9 % IV SOLN
10.0000 mg | Freq: Once | INTRAVENOUS | Status: AC
  Administered 2015-07-16: 10 mg via INTRAVENOUS
  Filled 2015-07-16: qty 1

## 2015-07-16 MED ORDER — SODIUM CHLORIDE 0.9 % IV SOLN
Freq: Once | INTRAVENOUS | Status: AC
  Administered 2015-07-16: 15:00:00 via INTRAVENOUS

## 2015-07-16 NOTE — Progress Notes (Signed)
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  PRIOR THERAPY: None  CURRENT THERAPY: Systemic chemotherapy with carboplatin for AUC of 5 and Alimta 500 MG/M2 every 3 weeks. First dose 04/16/2015. Status post 4 cycles.  INTERVAL HISTORY: Derek Howard 75 y.o. male returns to the clinic today for follow-up visit accompanied by his wife. The patient tolerated the last cycle of his systemic chemotherapy with carboplatin and Alimta fairly well with no significant adverse effects. He denied having any significant chest pain, shortness of breath but has mild cough with no hemoptysis. He has no fever or chills. He has no nausea or vomiting. He denied having any significant weight loss or night sweats. He is here today to start cycle #5 of his chemotherapy.  MEDICAL HISTORY: Past Medical History  Diagnosis Date  . Hearing loss   . Lung mass 03/10/2015  . Alcohol abuse     stopped 2013  . Cancer ([facility])     basal cell carcinoma  . Hypertension   . Encounter for antineoplastic chemotherapy 05/28/2015    ALLERGIES:  has No Known Allergies.  MEDICATIONS:  Current Outpatient Prescriptions  Medication Sig Dispense Refill  . dexamethasone (DECADRON) 4 MG tablet 4 mg by mouth twice a day the day before, day of and day after chemotherapy every 3 weeks 40 tablet 0  . folic acid (FOLVITE) 1 MG tablet Take 1 tablet (1 mg total) by mouth daily. 30 tablet 4  . hydrochlorothiazide (MICROZIDE) 12.5 MG capsule Take 1 capsule (12.5 mg total) by mouth daily. 30 capsule 4  . prochlorperazine (COMPAZINE) 10 MG tablet Take 1 tablet (10 mg total) by mouth every 6 (six) hours as needed for nausea or  vomiting. 30 tablet 0   No current facility-administered medications for this visit.    SURGICAL HISTORY:  Past Surgical History  Procedure Laterality Date  . Hernia repair    . Video bronchoscopy with endobronchial navigation N/A 03/27/2015    Procedure: VIDEO BRONCHOSCOPY WITH ENDOBRONCHIAL NAVIGATION;  Surgeon: Melrose Nakayama, MD;  Location: [facility];  Service: Thoracic;  Laterality: N/A;  . Video bronchoscopy with endobronchial ultrasound N/A 03/27/2015    Procedure: VIDEO BRONCHOSCOPY WITH ENDOBRONCHIAL ULTRASOUND;  Surgeon: Melrose Nakayama, MD;  Location: [facility];  Service: Thoracic;  Laterality: N/A;    REVIEW OF SYSTEMS:  A comprehensive review of systems was negative except for: Constitutional: positive for fatigue Respiratory: positive for dyspnea on exertion   PHYSICAL EXAMINATION: General appearance: alert, cooperative, fatigued and no distress Head: Normocephalic, without obvious abnormality, atraumatic Neck: no adenopathy, no JVD, supple, symmetrical, trachea midline and thyroid not enlarged, symmetric, no tenderness/mass/nodules Lymph nodes: Cervical, supraclavicular, and axillary nodes normal. Resp: clear to auscultation bilaterally Back: symmetric, no curvature. ROM normal. No CVA tenderness. Cardio: regular rate and rhythm, S1, S2 normal, no murmur, click, rub or gallop GI: soft, non-tender; bowel sounds normal; no masses,  no organomegaly Extremities: extremities normal, atraumatic, no cyanosis or edema Neurologic: Alert and oriented X 3, normal strength and tone. Normal symmetric reflexes. Normal coordination and gait  ECOG PERFORMANCE STATUS: 1 - Symptomatic but completely ambulatory  There were no vitals taken for this visit.  LABORATORY DATA: Lab Results  Component  Value Date   WBC 15.5* 06/25/2015   HGB 12.1* 06/25/2015   HCT 37.5* 06/25/2015   MCV 83.0 06/25/2015   PLT 288 06/25/2015      Chemistry      Component Value Date/Time   NA 142  06/25/2015 1110   NA 140 03/27/2015 0721   K 3.5 06/25/2015 1110   K 3.2* 03/27/2015 0721   CL 101 03/27/2015 0721   CO2 23 06/25/2015 1110   CO2 25 03/27/2015 0721   BUN 25.8 06/25/2015 1110   BUN 19 03/27/2015 0721   CREATININE 0.9 06/25/2015 1110   CREATININE 0.98 03/27/2015 0721   CREATININE 0.82 02/23/2015 2004      Component Value Date/Time   CALCIUM 9.6 06/25/2015 1110   CALCIUM 8.9 03/27/2015 0721   ALKPHOS 55 06/25/2015 1110   ALKPHOS 54 03/27/2015 0721   AST 17 06/25/2015 1110   AST 21 03/27/2015 0721   ALT 14 06/25/2015 1110   ALT 21 03/27/2015 0721   BILITOT 0.36 06/25/2015 1110   BILITOT 0.5 03/27/2015 0721       RADIOGRAPHIC STUDIES: Ct Chest W Contrast  06/22/2015  CLINICAL DATA:  Right lung cancer, chemotherapy in progress. EXAM: CT CHEST, ABDOMEN, AND PELVIS WITH CONTRAST TECHNIQUE: Multidetector CT imaging of the chest, abdomen and pelvis was performed following the standard protocol during bolus administration of intravenous contrast. CONTRAST:  162m ISOVUE-300 IOPAMIDOL (ISOVUE-300) INJECTION 61% COMPARISON:  Sweats 03/26/2015 and CT chest 03/05/2015. FINDINGS: CT CHEST FINDINGS Mediastinum/Lymph Nodes: Mediastinal lymph nodes measure up to 1.2 cm in the lower right paratracheal station, previously 1.5 cm. No hilar or axillary adenopathy. Atherosclerotic calcification of the arterial vasculature, including coronary arteries. Heart size normal. No pericardial effusion. Large hiatal hernia. Lungs/Pleura: Centrilobular emphysema. Spiculated right upper lobe mass measures 3.1 x 3.7 cm, stable. Scarring in the lingula no pleural fluid. Airway is unremarkable. Musculoskeletal: A lytic lesion in the posterior aspect of the left sixth rib measures 3.1 cm, previously 2.6 cm. CT ABDOMEN PELVIS FINDINGS Hepatobiliary: Numerous low-attenuation lesions are seen in the liver, measuring up to 2.3 cm, stable. Liver and gallbladder are otherwise unremarkable. No biliary ductal  dilatation. Pancreas: Negative. Spleen: Negative. Adrenals/Urinary Tract: Right adrenal gland is unremarkable. Left adrenal lesion measures 2.0 cm, stable and shown to be fluid in density on 03/26/2015. Low-attenuation lesions in the kidneys measure up to 3.9 cm to on the left and are likely cysts although definitive characterization is limited without precontrast imaging and/or due to size. Tiny stone in the left kidney. Ureters are decompressed. Bladder is low in volume. Stomach/Bowel: Large hiatal hernia. Stomach, small bowel, appendix and colon are otherwise unremarkable. Vascular/Lymphatic: Atherosclerotic calcification of the arterial vasculature without abdominal aortic aneurysm. No pathologically enlarged lymph nodes. Reproductive: Prostate is mildly enlarged. Other: Small bilateral inguinal hernias contain fat. No free fluid. Mesenteries and peritoneum are unremarkable. Musculoskeletal: No additional worrisome lytic or sclerotic lesions. Degenerative changes in the left hip. IMPRESSION: 1. Stable right upper lobe mass with slight decrease in size of mediastinal lymph nodes. 2. Lytic lesion in the posterior left sixth rib appears slightly larger. 3. Coronary artery calcification. 4. Left adrenal adenoma. 5. Tiny left renal stone. 6. Large hiatal hernia 7. Enlarged prostate. Electronically Signed   By: MLorin PicketM.D.   On: 06/22/2015 14:47   Ct Abdomen Pelvis W Contrast  06/22/2015  CLINICAL DATA:  Right lung cancer, chemotherapy in progress. EXAM: CT CHEST, ABDOMEN, AND PELVIS WITH CONTRAST TECHNIQUE: Multidetector CT imaging of the  chest, abdomen and pelvis was performed following the standard protocol during bolus administration of intravenous contrast. CONTRAST:  167m ISOVUE-300 IOPAMIDOL (ISOVUE-300) INJECTION 61% COMPARISON:  Sweats 03/26/2015 and CT chest 03/05/2015. FINDINGS: CT CHEST FINDINGS Mediastinum/Lymph Nodes: Mediastinal lymph nodes measure up to 1.2 cm in the lower right  paratracheal station, previously 1.5 cm. No hilar or axillary adenopathy. Atherosclerotic calcification of the arterial vasculature, including coronary arteries. Heart size normal. No pericardial effusion. Large hiatal hernia. Lungs/Pleura: Centrilobular emphysema. Spiculated right upper lobe mass measures 3.1 x 3.7 cm, stable. Scarring in the lingula no pleural fluid. Airway is unremarkable. Musculoskeletal: A lytic lesion in the posterior aspect of the left sixth rib measures 3.1 cm, previously 2.6 cm. CT ABDOMEN PELVIS FINDINGS Hepatobiliary: Numerous low-attenuation lesions are seen in the liver, measuring up to 2.3 cm, stable. Liver and gallbladder are otherwise unremarkable. No biliary ductal dilatation. Pancreas: Negative. Spleen: Negative. Adrenals/Urinary Tract: Right adrenal gland is unremarkable. Left adrenal lesion measures 2.0 cm, stable and shown to be fluid in density on 03/26/2015. Low-attenuation lesions in the kidneys measure up to 3.9 cm to on the left and are likely cysts although definitive characterization is limited without precontrast imaging and/or due to size. Tiny stone in the left kidney. Ureters are decompressed. Bladder is low in volume. Stomach/Bowel: Large hiatal hernia. Stomach, small bowel, appendix and colon are otherwise unremarkable. Vascular/Lymphatic: Atherosclerotic calcification of the arterial vasculature without abdominal aortic aneurysm. No pathologically enlarged lymph nodes. Reproductive: Prostate is mildly enlarged. Other: Small bilateral inguinal hernias contain fat. No free fluid. Mesenteries and peritoneum are unremarkable. Musculoskeletal: No additional worrisome lytic or sclerotic lesions. Degenerative changes in the left hip. IMPRESSION: 1. Stable right upper lobe mass with slight decrease in size of mediastinal lymph nodes. 2. Lytic lesion in the posterior left sixth rib appears slightly larger. 3. Coronary artery calcification. 4. Left adrenal adenoma. 5. Tiny  left renal stone. 6. Large hiatal hernia 7. Enlarged prostate. Electronically Signed   By: MLorin PicketM.D.   On: 06/22/2015 14:47    ASSESSMENT AND PLAN: This is a very pleasant 75years old white male recently diagnosed stage IV non-small cell lung cancer, adenocarcinoma presented with right upper lobe lung mass in addition to mediastinal lymphadenopathy and metastatic disease to the left posterior sixth rib diagnosed in February 2017. He is currently undergoing systemic chemotherapy with carboplatin and Alimta status post 4 cycles. He is tolerating his treatment well with no significant adverse effects.  I recommended for him to continue with systemic chemotherapy with carboplatin and Alimta and he will receive cycle #5 today. The patient would come back for follow-up visit in 3 weeks for evaluation with the start of cycle #6. He was advised to call if he has any concerning symptoms in the interval. The patient voices understanding of current disease status and treatment options and is in agreement with the current care plan.  All questions were answered. The patient knows to call the clinic with any problems, questions or concerns. We can certainly see the patient much sooner if necessary.  Disclaimer: This note was dictated with voice recognition software. Similar sounding words can inadvertently be transcribed and may not be corrected upon review.

## 2015-07-16 NOTE — Patient Instructions (Signed)
[facility] Discharge Instructions for Patients Receiving Chemotherapy  Today you received the following chemotherapy agents: Carboplatin and Alimta. To help prevent nausea and vomiting after your treatment, we encourage you to take your nausea medication. If you develop nausea and vomiting that is not controlled by your nausea medication, call the clinic.   BELOW ARE SYMPTOMS THAT SHOULD BE REPORTED IMMEDIATELY:  *FEVER GREATER THAN 100.5 F  *CHILLS WITH OR WITHOUT FEVER  NAUSEA AND VOMITING THAT IS NOT CONTROLLED WITH YOUR NAUSEA MEDICATION  *UNUSUAL SHORTNESS OF BREATH  *UNUSUAL BRUISING OR BLEEDING  TENDERNESS IN MOUTH AND THROAT WITH OR WITHOUT PRESENCE OF ULCERS  *URINARY PROBLEMS  *BOWEL PROBLEMS  UNUSUAL RASH Items with * indicate a potential emergency and should be followed up as soon as possible.  Feel free to call the clinic you have any questions or concerns. The clinic phone number is [phone].  Please show the [place] at check-in to the Emergency Department and triage nurse.

## 2015-07-16 NOTE — Progress Notes (Signed)
Oncology Nurse Navigator Documentation  Oncology Nurse Navigator Flowsheets 07/16/2015  Navigator Encounter Type Clinic/MDC  Treatment Phase Treatment  Barriers/Navigation Needs Financial  Interventions Other  Acuity Level 1   Patient is having a hard time with co-pay assistance.  I updated him to speak with Ms. White in managed care.  He qualifies for [employer].  I also updated him on he can be billed and be on a payment plan.  He stated he would follow up with her.

## 2015-07-16 NOTE — Telephone Encounter (Signed)
Gave pt apt & avs °

## 2015-07-16 NOTE — Telephone Encounter (Signed)
I left a message for pt to call back about his missed appt today.

## 2015-07-23 ENCOUNTER — Other Ambulatory Visit (HOSPITAL_BASED_OUTPATIENT_CLINIC_OR_DEPARTMENT_OTHER): Payer: Commercial Managed Care - HMO

## 2015-07-23 DIAGNOSIS — C3411 Malignant neoplasm of upper lobe, right bronchus or lung: Secondary | ICD-10-CM | POA: Diagnosis not present

## 2015-07-23 DIAGNOSIS — C3491 Malignant neoplasm of unspecified part of right bronchus or lung: Secondary | ICD-10-CM

## 2015-07-23 LAB — COMPREHENSIVE METABOLIC PANEL
ALT: 12 U/L (ref 0–55)
ANION GAP: 8 meq/L (ref 3–11)
AST: 15 U/L (ref 5–34)
Albumin: 3.4 g/dL — ABNORMAL LOW (ref 3.5–5.0)
Alkaline Phosphatase: 52 U/L (ref 40–150)
BILIRUBIN TOTAL: 0.45 mg/dL (ref 0.20–1.20)
BUN: 26.9 mg/dL — ABNORMAL HIGH (ref 7.0–26.0)
CO2: 31 meq/L — AB (ref 22–29)
CREATININE: 0.9 mg/dL (ref 0.7–1.3)
Calcium: 9.1 mg/dL (ref 8.4–10.4)
Chloride: 101 mEq/L (ref 98–109)
EGFR: 79 mL/min/{1.73_m2} — ABNORMAL LOW (ref >90)
GLUCOSE: 104 mg/dL (ref 70–140)
Potassium: 3.5 mEq/L (ref 3.5–5.1)
SODIUM: 139 meq/L (ref 136–145)
TOTAL PROTEIN: 6.9 g/dL (ref 6.4–8.3)

## 2015-07-23 LAB — CBC WITH DIFFERENTIAL/PLATELET
BASO%: 0.6 % (ref 0.0–2.0)
Basophils Absolute: 0 10*3/uL (ref 0.0–0.1)
EOS%: 0.5 % (ref 0.0–7.0)
Eosinophils Absolute: 0 10*3/uL (ref 0.0–0.5)
HCT: 35.5 % — ABNORMAL LOW (ref 38.4–49.9)
HGB: 11.5 g/dL — ABNORMAL LOW (ref 13.0–17.1)
LYMPH#: 1.2 10*3/uL (ref 0.9–3.3)
LYMPH%: 33.4 % (ref 14.0–49.0)
MCH: 27.4 pg (ref 27.2–33.4)
MCHC: 32.6 g/dL (ref 32.0–36.0)
MCV: 84.3 fL (ref 79.3–98.0)
MONO#: 0.4 10*3/uL (ref 0.1–0.9)
MONO%: 12.1 % (ref 0.0–14.0)
NEUT%: 53.4 % (ref 39.0–75.0)
NEUTROS ABS: 1.9 10*3/uL (ref 1.5–6.5)
PLATELETS: 183 10*3/uL (ref 140–400)
RBC: 4.21 10*6/uL (ref 4.20–5.82)
RDW: 17.6 % — AB (ref 11.0–14.6)
WBC: 3.6 10*3/uL — AB (ref 4.0–10.3)

## 2015-07-30 ENCOUNTER — Other Ambulatory Visit (HOSPITAL_BASED_OUTPATIENT_CLINIC_OR_DEPARTMENT_OTHER): Payer: Commercial Managed Care - HMO

## 2015-07-30 DIAGNOSIS — C3491 Malignant neoplasm of unspecified part of right bronchus or lung: Secondary | ICD-10-CM

## 2015-07-30 LAB — COMPREHENSIVE METABOLIC PANEL
ALT: 19 U/L (ref 0–55)
ANION GAP: 8 meq/L (ref 3–11)
AST: 19 U/L (ref 5–34)
Albumin: 3.4 g/dL — ABNORMAL LOW (ref 3.5–5.0)
Alkaline Phosphatase: 52 U/L (ref 40–150)
BUN: 18.5 mg/dL (ref 7.0–26.0)
CALCIUM: 9.3 mg/dL (ref 8.4–10.4)
CHLORIDE: 102 meq/L (ref 98–109)
CO2: 30 mEq/L — ABNORMAL HIGH (ref 22–29)
Creatinine: 0.9 mg/dL (ref 0.7–1.3)
EGFR: 83 mL/min/{1.73_m2} — AB (ref >90)
Glucose: 110 mg/dl (ref 70–140)
POTASSIUM: 3.4 meq/L — AB (ref 3.5–5.1)
Sodium: 140 mEq/L (ref 136–145)
TOTAL PROTEIN: 7.1 g/dL (ref 6.4–8.3)
Total Bilirubin: 0.32 mg/dL (ref 0.20–1.20)

## 2015-07-30 LAB — CBC WITH DIFFERENTIAL/PLATELET
BASO%: 0.2 % (ref 0.0–2.0)
Basophils Absolute: 0 10*3/uL (ref 0.0–0.1)
EOS%: 0.5 % (ref 0.0–7.0)
Eosinophils Absolute: 0 10*3/uL (ref 0.0–0.5)
HEMATOCRIT: 34.5 % — AB (ref 38.4–49.9)
HEMOGLOBIN: 11.2 g/dL — AB (ref 13.0–17.1)
LYMPH#: 1.9 10*3/uL (ref 0.9–3.3)
LYMPH%: 29.7 % (ref 14.0–49.0)
MCH: 28.2 pg (ref 27.2–33.4)
MCHC: 32.5 g/dL (ref 32.0–36.0)
MCV: 86.9 fL (ref 79.3–98.0)
MONO#: 0.9 10*3/uL (ref 0.1–0.9)
MONO%: 13.6 % (ref 0.0–14.0)
NEUT%: 56 % (ref 39.0–75.0)
NEUTROS ABS: 3.5 10*3/uL (ref 1.5–6.5)
PLATELETS: 119 10*3/uL — AB (ref 140–400)
RBC: 3.97 10*6/uL — ABNORMAL LOW (ref 4.20–5.82)
RDW: 16.8 % — ABNORMAL HIGH (ref 11.0–14.6)
WBC: 6.3 10*3/uL (ref 4.0–10.3)

## 2015-08-06 ENCOUNTER — Other Ambulatory Visit (HOSPITAL_BASED_OUTPATIENT_CLINIC_OR_DEPARTMENT_OTHER): Payer: Commercial Managed Care - HMO

## 2015-08-06 ENCOUNTER — Ambulatory Visit (HOSPITAL_BASED_OUTPATIENT_CLINIC_OR_DEPARTMENT_OTHER): Payer: Commercial Managed Care - HMO

## 2015-08-06 ENCOUNTER — Ambulatory Visit (HOSPITAL_BASED_OUTPATIENT_CLINIC_OR_DEPARTMENT_OTHER): Payer: Commercial Managed Care - HMO | Admitting: Internal Medicine

## 2015-08-06 ENCOUNTER — Encounter: Payer: Self-pay | Admitting: Internal Medicine

## 2015-08-06 VITALS — BP 152/65 | HR 81 | Temp 98.4°F | Resp 18 | Ht 66.0 in | Wt 156.4 lb

## 2015-08-06 DIAGNOSIS — C3491 Malignant neoplasm of unspecified part of right bronchus or lung: Secondary | ICD-10-CM

## 2015-08-06 DIAGNOSIS — C3411 Malignant neoplasm of upper lobe, right bronchus or lung: Secondary | ICD-10-CM

## 2015-08-06 DIAGNOSIS — C7951 Secondary malignant neoplasm of bone: Secondary | ICD-10-CM

## 2015-08-06 DIAGNOSIS — Z5111 Encounter for antineoplastic chemotherapy: Secondary | ICD-10-CM

## 2015-08-06 LAB — COMPREHENSIVE METABOLIC PANEL
ALBUMIN: 3.4 g/dL — AB (ref 3.5–5.0)
ALK PHOS: 48 U/L (ref 40–150)
ALT: 15 U/L (ref 0–55)
AST: 15 U/L (ref 5–34)
Anion Gap: 9 mEq/L (ref 3–11)
BUN: 28.9 mg/dL — ABNORMAL HIGH (ref 7.0–26.0)
CHLORIDE: 107 meq/L (ref 98–109)
CO2: 25 mEq/L (ref 22–29)
Calcium: 9.5 mg/dL (ref 8.4–10.4)
Creatinine: 0.9 mg/dL (ref 0.7–1.3)
EGFR: 83 mL/min/{1.73_m2} — AB (ref >90)
GLUCOSE: 154 mg/dL — AB (ref 70–140)
POTASSIUM: 3.8 meq/L (ref 3.5–5.1)
SODIUM: 141 meq/L (ref 136–145)
TOTAL PROTEIN: 7.4 g/dL (ref 6.4–8.3)
Total Bilirubin: <0.3 mg/dL (ref 0.20–1.20)

## 2015-08-06 LAB — CBC WITH DIFFERENTIAL/PLATELET
BASO%: 0.2 % (ref 0.0–2.0)
Basophils Absolute: 0 10*3/uL (ref 0.0–0.1)
EOS%: 0 % (ref 0.0–7.0)
Eosinophils Absolute: 0 10*3/uL (ref 0.0–0.5)
HCT: 35.1 % — ABNORMAL LOW (ref 38.4–49.9)
HEMOGLOBIN: 11.7 g/dL — AB (ref 13.0–17.1)
LYMPH%: 12.1 % — AB (ref 14.0–49.0)
MCH: 28.1 pg (ref 27.2–33.4)
MCHC: 33.2 g/dL (ref 32.0–36.0)
MCV: 84.6 fL (ref 79.3–98.0)
MONO#: 0.6 10*3/uL (ref 0.1–0.9)
MONO%: 7.2 % (ref 0.0–14.0)
NEUT%: 80.5 % — ABNORMAL HIGH (ref 39.0–75.0)
NEUTROS ABS: 6.9 10*3/uL — AB (ref 1.5–6.5)
Platelets: 385 10*3/uL (ref 140–400)
RBC: 4.15 10*6/uL — AB (ref 4.20–5.82)
RDW: 18.9 % — AB (ref 11.0–14.6)
WBC: 8.6 10*3/uL (ref 4.0–10.3)
lymph#: 1 10*3/uL (ref 0.9–3.3)

## 2015-08-06 MED ORDER — CYANOCOBALAMIN 1000 MCG/ML IJ SOLN
1000.0000 ug | Freq: Once | INTRAMUSCULAR | Status: AC
  Administered 2015-08-06: 1000 ug via INTRAMUSCULAR

## 2015-08-06 MED ORDER — PALONOSETRON HCL INJECTION 0.25 MG/5ML
INTRAVENOUS | Status: AC
  Filled 2015-08-06: qty 5

## 2015-08-06 MED ORDER — SODIUM CHLORIDE 0.9 % IV SOLN
Freq: Once | INTRAVENOUS | Status: AC
  Administered 2015-08-06: 14:00:00 via INTRAVENOUS

## 2015-08-06 MED ORDER — SODIUM CHLORIDE 0.9 % IV SOLN
10.0000 mg | Freq: Once | INTRAVENOUS | Status: AC
  Administered 2015-08-06: 10 mg via INTRAVENOUS
  Filled 2015-08-06: qty 1

## 2015-08-06 MED ORDER — SODIUM CHLORIDE 0.9 % IV SOLN
446.0000 mg | Freq: Once | INTRAVENOUS | Status: AC
  Administered 2015-08-06: 450 mg via INTRAVENOUS
  Filled 2015-08-06: qty 45

## 2015-08-06 MED ORDER — PALONOSETRON HCL INJECTION 0.25 MG/5ML
0.2500 mg | Freq: Once | INTRAVENOUS | Status: AC
  Administered 2015-08-06: 0.25 mg via INTRAVENOUS

## 2015-08-06 MED ORDER — CYANOCOBALAMIN 1000 MCG/ML IJ SOLN
INTRAMUSCULAR | Status: AC
  Filled 2015-08-06: qty 1

## 2015-08-06 MED ORDER — SODIUM CHLORIDE 0.9 % IV SOLN
490.0000 mg/m2 | Freq: Once | INTRAVENOUS | Status: AC
  Administered 2015-08-06: 900 mg via INTRAVENOUS
  Filled 2015-08-06: qty 8

## 2015-08-06 NOTE — Patient Instructions (Signed)
[facility] Discharge Instructions for Patients Receiving Chemotherapy  Today you received the following chemotherapy agents:  Alimta and Carboplatin  To help prevent nausea and vomiting after your treatment, we encourage you to take your nausea medication as ordered per MD.   If you develop nausea and vomiting that is not controlled by your nausea medication, call the clinic.   BELOW ARE SYMPTOMS THAT SHOULD BE REPORTED IMMEDIATELY:  *FEVER GREATER THAN 100.5 F  *CHILLS WITH OR WITHOUT FEVER  NAUSEA AND VOMITING THAT IS NOT CONTROLLED WITH YOUR NAUSEA MEDICATION  *UNUSUAL SHORTNESS OF BREATH  *UNUSUAL BRUISING OR BLEEDING  TENDERNESS IN MOUTH AND THROAT WITH OR WITHOUT PRESENCE OF ULCERS  *URINARY PROBLEMS  *BOWEL PROBLEMS  UNUSUAL RASH Items with * indicate a potential emergency and should be followed up as soon as possible.  Feel free to call the clinic you have any questions or concerns. The clinic phone number is [phone].  Please show the [facility] at check-in to the Emergency Department and triage nurse.

## 2015-08-06 NOTE — Progress Notes (Signed)
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  PRIOR THERAPY: None  CURRENT THERAPY: Systemic chemotherapy with carboplatin for AUC of 5 and Alimta 500 MG/M2 every 3 weeks. First dose 04/16/2015. Status post 5 cycles.  INTERVAL HISTORY: Derek Howard 75 y.o. male returns to the clinic today for follow-up visit accompanied by his wife. The patient has been tolerating his systemic chemotherapy with carboplatin and Alimta fairly well with no significant adverse effects. He denied having any significant chest pain, shortness of breath, cough or hemoptysis. He has no fever or chills. He has no nausea or vomiting. He denied having any significant weight loss or night sweats. He is here today to start cycle #6 of his chemotherapy.  MEDICAL HISTORY: Past Medical History  Diagnosis Date  . Hearing loss   . Lung mass 03/10/2015  . Alcohol abuse     stopped 2013  . Cancer ([place])     basal cell carcinoma  . Hypertension   . Encounter for antineoplastic chemotherapy 05/28/2015    ALLERGIES:  has No Known Allergies.  MEDICATIONS:  Current Outpatient Prescriptions  Medication Sig Dispense Refill  . dexamethasone (DECADRON) 4 MG tablet 4 mg by mouth twice a day the day before, day of and day after chemotherapy every 3 weeks 40 tablet 0  . folic acid (FOLVITE) 1 MG tablet Take 1 tablet (1 mg total) by mouth daily. 30 tablet 4  . hydrochlorothiazide (MICROZIDE) 12.5 MG capsule Take 1 capsule (12.5 mg total) by mouth daily. 30 capsule 4  . prochlorperazine (COMPAZINE) 10 MG tablet Take 1 tablet (10 mg total) by mouth every 6 (six) hours as needed for nausea or vomiting. (Patient not  taking: Reported on 07/16/2015) 30 tablet 0   No current facility-administered medications for this visit.    SURGICAL HISTORY:  Past Surgical History  Procedure Laterality Date  . Hernia repair    . Video bronchoscopy with endobronchial navigation N/A 03/27/2015    Procedure: VIDEO BRONCHOSCOPY WITH ENDOBRONCHIAL NAVIGATION;  Surgeon: Melrose Nakayama, MD;  Location: [facility];  Service: Thoracic;  Laterality: N/A;  . Video bronchoscopy with endobronchial ultrasound N/A 03/27/2015    Procedure: VIDEO BRONCHOSCOPY WITH ENDOBRONCHIAL ULTRASOUND;  Surgeon: Melrose Nakayama, MD;  Location: [facility];  Service: Thoracic;  Laterality: N/A;    REVIEW OF SYSTEMS:  A comprehensive review of systems was negative.   PHYSICAL EXAMINATION: General appearance: alert, cooperative, fatigued and no distress Head: Normocephalic, without obvious abnormality, atraumatic Neck: no adenopathy, no JVD, supple, symmetrical, trachea midline and thyroid not enlarged, symmetric, no tenderness/mass/nodules Lymph nodes: Cervical, supraclavicular, and axillary nodes normal. Resp: clear to auscultation bilaterally Back: symmetric, no curvature. ROM normal. No CVA tenderness. Cardio: regular rate and rhythm, S1, S2 normal, no murmur, click, rub or gallop GI: soft, non-tender; bowel sounds normal; no masses,  no organomegaly Extremities: extremities normal, atraumatic, no cyanosis or edema Neurologic: Alert and oriented X 3, normal strength and tone. Normal symmetric reflexes. Normal coordination and gait  ECOG PERFORMANCE STATUS: 1 - Symptomatic but completely ambulatory  Blood pressure 152/65, pulse 81, temperature 98.4 F (36.9 C), temperature source Oral, resp. rate 18, height '5\' 6"'$  (1.676 m), weight 156 lb 6.4 oz (70.943  kg), SpO2 97 %.  LABORATORY DATA: Lab Results  Component Value Date   WBC 8.6 08/06/2015   HGB 11.7* 08/06/2015   HCT 35.1* 08/06/2015   MCV 84.6 08/06/2015   PLT 385 08/06/2015       Chemistry      Component Value Date/Time   NA 141 08/06/2015 1139   NA 140 03/27/2015 0721   K 3.8 08/06/2015 1139   K 3.2* 03/27/2015 0721   CL 101 03/27/2015 0721   CO2 25 08/06/2015 1139   CO2 25 03/27/2015 0721   BUN 28.9* 08/06/2015 1139   BUN 19 03/27/2015 0721   CREATININE 0.9 08/06/2015 1139   CREATININE 0.98 03/27/2015 0721   CREATININE 0.82 02/23/2015 2004      Component Value Date/Time   CALCIUM 9.5 08/06/2015 1139   CALCIUM 8.9 03/27/2015 0721   ALKPHOS 48 08/06/2015 1139   ALKPHOS 54 03/27/2015 0721   AST 15 08/06/2015 1139   AST 21 03/27/2015 0721   ALT 15 08/06/2015 1139   ALT 21 03/27/2015 0721   BILITOT <0.30 08/06/2015 1139   BILITOT 0.5 03/27/2015 0721       RADIOGRAPHIC STUDIES: No results found.  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 He is currently undergoing systemic chemotherapy with carboplatin and Alimta status post 5 cycles. He is tolerating his treatment well with no significant adverse effects.  I recommended for him to continue with systemic chemotherapy with carboplatin and Alimta with cycle #6 today. I will see him back for follow-up visit in one month's after repeating CT scan of the chest and abdomen for restaging of his disease. He was advised to call if he has any concerning symptoms in the interval. The patient voices understanding of current disease status and treatment options and is in agreement with the current care plan.  All questions were answered. The patient knows to call the clinic with any problems, questions or concerns. We can certainly see the patient much sooner if necessary.  Disclaimer: This note was dictated with voice recognition software. Similar  sounding words can inadvertently be transcribed and may not be corrected upon review.

## 2015-08-07 ENCOUNTER — Telehealth: Payer: Self-pay | Admitting: Internal Medicine

## 2015-08-07 NOTE — Telephone Encounter (Signed)
per pof to sch pt CT scan-sent Darlena email to auth-gave pt copy of avs

## 2015-08-26 ENCOUNTER — Ambulatory Visit (HOSPITAL_COMMUNITY)
Admission: EM | Admit: 2015-08-26 | Discharge: 2015-08-26 | Disposition: A | Discharge status: Home / Self Care | Payer: Commercial Managed Care - HMO | Attending: Emergency Medicine | Admitting: Emergency Medicine

## 2015-08-26 ENCOUNTER — Encounter (HOSPITAL_COMMUNITY): Payer: Self-pay | Admitting: Family Medicine

## 2015-08-26 DIAGNOSIS — M7918 Myalgia, other site: Secondary | ICD-10-CM

## 2015-08-26 DIAGNOSIS — M791 Myalgia: Secondary | ICD-10-CM

## 2015-08-26 DIAGNOSIS — C3490 Malignant neoplasm of unspecified part of unspecified bronchus or lung: Secondary | ICD-10-CM | POA: Diagnosis not present

## 2015-08-26 MED ORDER — HYDROCODONE-ACETAMINOPHEN 7.5-325 MG PO TABS: 1.0000 | ORAL_TABLET | ORAL | Status: DC | PRN

## 2015-08-26 NOTE — Discharge Instructions (Signed)
Muscle Pain, Adult Muscle pain (myalgia) may be caused by many things, including:  Overuse or muscle strain, especially if you are not in shape. This is the most common cause of muscle pain.  Injury.  Bruises.  Viruses, such as the flu.  Infectious diseases.  Fibromyalgia, which is a chronic condition that causes muscle tenderness, fatigue, and headache.  Autoimmune diseases, including lupus.  Certain drugs, including ACE inhibitors and statins. Muscle pain may be mild or severe. In most cases, the pain lasts only a short time and goes away without treatment. To diagnose the cause of your muscle pain, your health care provider will take your medical history. This means he or she will ask you when your muscle pain began and what has been happening. If you have not had muscle pain for very long, your health care provider may want to wait before doing much testing. If your muscle pain has lasted a long time, your health care provider may want to run tests right away. If your health care provider thinks your muscle pain may be caused by illness, you may need to have additional tests to rule out certain conditions.  Treatment for muscle pain depends on the cause. Home care is often enough to relieve muscle pain. Your health care provider may also prescribe anti-inflammatory medicine. HOME CARE INSTRUCTIONS Watch your condition for any changes. The following actions may help to lessen any discomfort you are feeling:  Only take over-the-counter or prescription medicines as directed by your health care provider.  Apply ice to the sore muscle:  Put ice in a plastic bag.  Place a towel between your skin and the bag.  Leave the ice on for 15-20 minutes, 3-4 times a day.  You may alternate applying hot and cold packs to the muscle as directed by your health care provider.  If overuse is causing your muscle pain, slow down your activities until the pain goes away.  Remember that it is normal  to feel some muscle pain after starting a workout program. Muscles that have not been used often will be sore at first.  Do regular, gentle exercises if you are not usually active.  Warm up before exercising to lower your risk of muscle pain.  Do not continue working out if the pain is very bad. Bad pain could mean you have injured a muscle. SEEK MEDICAL CARE IF:  Your muscle pain gets worse, and medicines do not help.  You have muscle pain that lasts longer than 3 days.  You have a rash or fever along with muscle pain.  You have muscle pain after a tick bite.  You have muscle pain while working out, even though you are in good physical condition.  You have redness, soreness, or swelling along with muscle pain.  You have muscle pain after starting a new medicine or changing the dose of a medicine. SEEK IMMEDIATE MEDICAL CARE IF:  You have trouble breathing.  You have trouble swallowing.  You have muscle pain along with a stiff neck, fever, and vomiting.  You have severe muscle weakness or cannot move part of your body. MAKE SURE YOU:   Understand these instructions.  Will watch your condition.  Will get help right away if you are not doing well or get worse.   This information is not intended to replace advice given to you by your health care provider. Make sure you discuss any questions you have with your health care provider.   Document Released:  12/30/2005 Document Revised: 02/28/2014 Document Reviewed: 12/04/2012 Elsevier Interactive Patient Education 2016 Elsevier Inc[employer]  Musculoskeletal Pain Musculoskeletal pain is muscle and boney aches and pains. These pains can occur in any part of the body. Your caregiver may treat you without knowing the cause of the pain. They may treat you if blood or urine tests, X-rays, and other tests were normal.  CAUSES There is often not a definite cause or reason for these pains. These pains may be caused by a type of germ (virus).  The discomfort may also come from overuse. Overuse includes working out too hard when your body is not fit. Boney aches also come from weather changes. Bone is sensitive to atmospheric pressure changes. HOME CARE INSTRUCTIONS   Ask when your test results will be ready. Make sure you get your test results.  Only take over-the-counter or prescription medicines for pain, discomfort, or fever as directed by your caregiver. If you were given medications for your condition, do not drive, operate machinery or power tools, or sign legal documents for 24 hours. Do not drink alcohol. Do not take sleeping pills or other medications that may interfere with treatment.  Continue all activities unless the activities cause more pain. When the pain lessens, slowly resume normal activities. Gradually increase the intensity and duration of the activities or exercise.  During periods of severe pain, bed rest may be helpful. Lay or sit in any position that is comfortable.  Putting ice on the injured area.  Put ice in a bag.  Place a towel between your skin and the bag.  Leave the ice on for 15 to 20 minutes, 3 to 4 times a day.  Follow up with your caregiver for continued problems and no reason can be found for the pain. If the pain becomes worse or does not go away, it may be necessary to repeat tests or do additional testing. Your caregiver may need to look further for a possible cause. SEEK IMMEDIATE MEDICAL CARE IF:  You have pain that is getting worse and is not relieved by medications.  You develop chest pain that is associated with shortness or breath, sweating, feeling sick to your stomach (nauseous), or throw up (vomit).  Your pain becomes localized to the abdomen.  You develop any new symptoms that seem different or that concern you. MAKE SURE YOU:   Understand these instructions.  Will watch your condition.  Will get help right away if you are not doing well or get worse.   This  information is not intended to replace advice given to you by your health care provider. Make sure you discuss any questions you have with your health care provider.   Document Released: 02/07/2005 Document Revised: 05/02/2011 Document Reviewed: 10/12/2012 Elsevier Interactive Patient Education [employer].

## 2015-08-26 NOTE — ED Notes (Signed)
Pt here for pain all over his body. sts that it has been going on for 5 months. sts that he is currently receiving chemo for lung cancer.

## 2015-08-26 NOTE — ED Provider Notes (Signed)
CSN: [id number]     Arrival date & time 08/26/15  1722 History   First MD Initiated Contact with Patient 08/26/15 1815     Chief Complaint  Patient presents with  . Pain   (Consider location/radiation/quality/duration/timing/severity/associated sxs/prior Treatment) HPI Comments: 75 year old male with a recent diagnosis of lung mass as well as alcohol abuse, hypertension and hearing loss is currently undergoing chemotherapy. He is also had chronic pain. He describes various areas of most the skeletal pain. Pain in the left side of his back as neck, trapezius and upper back muscles, knees and ankles elbows arms and various other areas. This pain started approximate 5 months ago. It is worse with movement and ambulation. He states that his doctors will treat his pain. He has been given tramadol in the past its like taking candy.   Past Medical History  Diagnosis Date  . Hearing loss   . Lung mass 03/10/2015  . Alcohol abuse     stopped 2013  . Cancer ([place])     basal cell carcinoma  . Hypertension   . Encounter for antineoplastic chemotherapy 05/28/2015   Past Surgical History  Procedure Laterality Date  . Hernia repair    . Video bronchoscopy with endobronchial navigation N/A 03/27/2015    Procedure: VIDEO BRONCHOSCOPY WITH ENDOBRONCHIAL NAVIGATION;  Surgeon: Melrose Nakayama, MD;  Location: [facility];  Service: Thoracic;  Laterality: N/A;  . Video bronchoscopy with endobronchial ultrasound N/A 03/27/2015    Procedure: VIDEO BRONCHOSCOPY WITH ENDOBRONCHIAL ULTRASOUND;  Surgeon: Melrose Nakayama, MD;  Location: [facility];  Service: Thoracic;  Laterality: N/A;   History reviewed. No pertinent family history. Social History  Substance Use Topics  . Smoking status: Former Smoker -- 2.00 packs/day for 50 years    Types: Cigarettes    Quit date: 03/23/2005  . Smokeless tobacco: Never Used  . Alcohol Use: No     Comment: stopped drinking 2013    Review of Systems  Constitutional: Negative.    Respiratory: Negative.   Gastrointestinal: Negative.   Genitourinary: Negative.   Musculoskeletal: Positive for myalgias, back pain, arthralgias and neck pain.       As per HPI  Skin: Negative.   Neurological: Negative for dizziness, weakness, numbness and headaches.    Allergies  Review of patient's allergies indicates no known allergies.  Home Medications   Prior to Admission medications   Medication Sig Start Date End Date Taking? Authorizing Provider  dexamethasone (DECADRON) 4 MG tablet 4 mg by mouth twice a day the day before, day of and day after chemotherapy every 3 weeks 04/09/15   Curt Bears, MD  folic acid (FOLVITE) 1 MG tablet Take 1 tablet (1 mg total) by mouth daily. 04/09/15   Curt Bears, MD  hydrochlorothiazide (MICROZIDE) 12.5 MG capsule Take 1 capsule (12.5 mg total) by mouth daily. 02/23/15   Tereasa Coop, PA-C  HYDROcodone-acetaminophen (NORCO) 7.5-325 MG tablet Take 1 tablet by mouth every 4 (four) hours as needed. 08/26/15   Janne Napoleon, NP  prochlorperazine (COMPAZINE) 10 MG tablet Take 1 tablet (10 mg total) by mouth every 6 (six) hours as needed for nausea or vomiting. Patient not taking: Reported on 07/16/2015 04/09/15   Curt Bears, MD   Meds Ordered and Administered this Visit  Medications - No data to display  BP 144/62 mmHg  Pulse 90  Temp(Src) 99.6 F (37.6 C) (Oral)  Resp 16  SpO2 95% No data found.   Physical Exam  Constitutional: He is oriented  to person, place, and time. He appears well-developed and well-nourished. No distress.  Eyes: EOM are normal.  Neck: Normal range of motion. Neck supple.  Cardiovascular: Normal rate.   Pulmonary/Chest: Effort normal. No respiratory distress.  Musculoskeletal: He exhibits no edema.  Neurological: He is alert and oriented to person, place, and time. He exhibits normal muscle tone.  Skin: Skin is warm and dry.  Psychiatric: He has a normal mood and affect.  Nursing note and vitals  reviewed.   ED Course  Procedures (including critical care time)  Labs Review Labs Reviewed - No data to display  Imaging Review No results found.   Visual Acuity Review  Right Eye Distance:   Left Eye Distance:   Bilateral Distance:    Right Eye Near:   Left Eye Near:    Bilateral Near:         MDM   1. Malignant neoplasm of lung, unspecified laterality, unspecified part of lung ([place])   2. Musculoskeletal pain    Meds ordered this encounter  Medications  . HYDROcodone-acetaminophen (NORCO) 7.5-325 MG tablet    Sig: Take 1 tablet by mouth every 4 (four) hours as needed.    Dispense:  15 tablet    Refill:  0    Order Specific Question:  Supervising Provider    Answer:  Melony Overly [id number]   Must follow-up with PCP for pain control. May need referral to pain management clinic.    Janne Napoleon, NP 08/26/15 [phone]

## 2015-09-03 ENCOUNTER — Other Ambulatory Visit (HOSPITAL_BASED_OUTPATIENT_CLINIC_OR_DEPARTMENT_OTHER): Payer: Commercial Managed Care - HMO

## 2015-09-03 ENCOUNTER — Ambulatory Visit (HOSPITAL_COMMUNITY)
Admission: RE | Admit: 2015-09-03 | Discharge: 2015-09-03 | Disposition: A | Discharge status: Home / Self Care | Payer: Commercial Managed Care - HMO | Source: Ambulatory Visit | Attending: Internal Medicine | Admitting: Internal Medicine

## 2015-09-03 ENCOUNTER — Telehealth: Payer: Self-pay | Admitting: Medical Oncology

## 2015-09-03 ENCOUNTER — Encounter (HOSPITAL_COMMUNITY): Payer: Self-pay

## 2015-09-03 DIAGNOSIS — I2510 Atherosclerotic heart disease of native coronary artery without angina pectoris: Secondary | ICD-10-CM | POA: Insufficient documentation

## 2015-09-03 DIAGNOSIS — C7951 Secondary malignant neoplasm of bone: Secondary | ICD-10-CM | POA: Insufficient documentation

## 2015-09-03 DIAGNOSIS — R590 Localized enlarged lymph nodes: Secondary | ICD-10-CM | POA: Diagnosis not present

## 2015-09-03 DIAGNOSIS — C3411 Malignant neoplasm of upper lobe, right bronchus or lung: Secondary | ICD-10-CM | POA: Diagnosis not present

## 2015-09-03 DIAGNOSIS — K5730 Diverticulosis of large intestine without perforation or abscess without bleeding: Secondary | ICD-10-CM | POA: Insufficient documentation

## 2015-09-03 DIAGNOSIS — C3491 Malignant neoplasm of unspecified part of right bronchus or lung: Secondary | ICD-10-CM | POA: Insufficient documentation

## 2015-09-03 DIAGNOSIS — Z5111 Encounter for antineoplastic chemotherapy: Secondary | ICD-10-CM

## 2015-09-03 DIAGNOSIS — K449 Diaphragmatic hernia without obstruction or gangrene: Secondary | ICD-10-CM | POA: Insufficient documentation

## 2015-09-03 DIAGNOSIS — K4090 Unilateral inguinal hernia, without obstruction or gangrene, not specified as recurrent: Secondary | ICD-10-CM | POA: Diagnosis not present

## 2015-09-03 DIAGNOSIS — N400 Enlarged prostate without lower urinary tract symptoms: Secondary | ICD-10-CM | POA: Insufficient documentation

## 2015-09-03 DIAGNOSIS — Z9221 Personal history of antineoplastic chemotherapy: Secondary | ICD-10-CM | POA: Insufficient documentation

## 2015-09-03 DIAGNOSIS — J439 Emphysema, unspecified: Secondary | ICD-10-CM | POA: Diagnosis not present

## 2015-09-03 DIAGNOSIS — I700 Atherosclerosis of aorta: Secondary | ICD-10-CM | POA: Diagnosis not present

## 2015-09-03 LAB — CBC WITH DIFFERENTIAL/PLATELET
BASO%: 0.4 % (ref 0.0–2.0)
Basophils Absolute: 0 10*3/uL (ref 0.0–0.1)
EOS%: 0.4 % (ref 0.0–7.0)
Eosinophils Absolute: 0 10*3/uL (ref 0.0–0.5)
HCT: 35.3 % — ABNORMAL LOW (ref 38.4–49.9)
HEMOGLOBIN: 11.5 g/dL — AB (ref 13.0–17.1)
LYMPH%: 24.5 % (ref 14.0–49.0)
MCH: 28 pg (ref 27.2–33.4)
MCHC: 32.7 g/dL (ref 32.0–36.0)
MCV: 85.7 fL (ref 79.3–98.0)
MONO#: 1.2 10*3/uL — ABNORMAL HIGH (ref 0.1–0.9)
MONO%: 15.6 % — AB (ref 0.0–14.0)
NEUT%: 59.1 % (ref 39.0–75.0)
NEUTROS ABS: 4.7 10*3/uL (ref 1.5–6.5)
PLATELETS: 251 10*3/uL (ref 140–400)
RBC: 4.12 10*6/uL — ABNORMAL LOW (ref 4.20–5.82)
RDW: 17.8 % — AB (ref 11.0–14.6)
WBC: 7.9 10*3/uL (ref 4.0–10.3)
lymph#: 1.9 10*3/uL (ref 0.9–3.3)

## 2015-09-03 LAB — COMPREHENSIVE METABOLIC PANEL
ALBUMIN: 3.4 g/dL — AB (ref 3.5–5.0)
ALK PHOS: 57 U/L (ref 40–150)
ALT: 15 U/L (ref 0–55)
AST: 19 U/L (ref 5–34)
Anion Gap: 11 mEq/L (ref 3–11)
BILIRUBIN TOTAL: 0.36 mg/dL (ref 0.20–1.20)
BUN: 22.8 mg/dL (ref 7.0–26.0)
CO2: 29 meq/L (ref 22–29)
CREATININE: 0.9 mg/dL (ref 0.7–1.3)
Calcium: 9.2 mg/dL (ref 8.4–10.4)
Chloride: 102 mEq/L (ref 98–109)
EGFR: 78 mL/min/{1.73_m2} — ABNORMAL LOW (ref >90)
GLUCOSE: 108 mg/dL (ref 70–140)
Potassium: 3.3 mEq/L — ABNORMAL LOW (ref 3.5–5.1)
SODIUM: 142 meq/L (ref 136–145)
TOTAL PROTEIN: 7.4 g/dL (ref 6.4–8.3)

## 2015-09-03 MED ORDER — IOPAMIDOL (ISOVUE-300) INJECTION 61%
100.0000 mL | Freq: Once | INTRAVENOUS | Status: AC | PRN
  Administered 2015-09-03: 100 mL via INTRAVENOUS

## 2015-09-03 NOTE — Telephone Encounter (Signed)
-----   Message from Curt Bears, MD sent at 09/03/2015 10:58 AM EDT ----- Call patient with the result and I advise him to increase the K rich diet.

## 2015-09-03 NOTE — Telephone Encounter (Signed)
I left a message for  patient to increase  K+ rich foods  in diet including: bananas,oranges, avocado, squash, raisins, potatoes, cabbage,cauliflower, tuna,honey,beef,chicken,sardines,milk,turkey,

## 2015-09-03 NOTE — Progress Notes (Signed)
Quick Note:  Call patient with the result and I advise him to increase the K rich diet. ______

## 2015-09-09 ENCOUNTER — Telehealth: Payer: Self-pay | Admitting: Internal Medicine

## 2015-09-09 NOTE — Telephone Encounter (Signed)
Called to see if Dr. Doreene Burke had any cancellations this month, however nothing has become available States pt has chronic pain and needs pain medication  Hoping to receive a few pills to get pt through until an office visit can be scheduled  She was advised that this is unlikely without an office visit She would like to be followed up with  Thank you

## 2015-09-10 ENCOUNTER — Other Ambulatory Visit: Payer: Self-pay | Admitting: Medical Oncology

## 2015-09-10 ENCOUNTER — Ambulatory Visit (HOSPITAL_BASED_OUTPATIENT_CLINIC_OR_DEPARTMENT_OTHER): Payer: Commercial Managed Care - HMO | Admitting: Internal Medicine

## 2015-09-10 ENCOUNTER — Encounter: Payer: Self-pay | Admitting: Internal Medicine

## 2015-09-10 ENCOUNTER — Telehealth: Payer: Self-pay | Admitting: Internal Medicine

## 2015-09-10 VITALS — BP 175/66 | HR 96 | Temp 98.4°F | Resp 17 | Ht 66.0 in | Wt 154.6 lb

## 2015-09-10 DIAGNOSIS — C3491 Malignant neoplasm of unspecified part of right bronchus or lung: Secondary | ICD-10-CM

## 2015-09-10 DIAGNOSIS — Z5111 Encounter for antineoplastic chemotherapy: Secondary | ICD-10-CM

## 2015-09-10 DIAGNOSIS — R0609 Other forms of dyspnea: Secondary | ICD-10-CM | POA: Diagnosis not present

## 2015-09-10 DIAGNOSIS — M545 Low back pain: Secondary | ICD-10-CM | POA: Diagnosis not present

## 2015-09-10 DIAGNOSIS — C3411 Malignant neoplasm of upper lobe, right bronchus or lung: Secondary | ICD-10-CM | POA: Diagnosis not present

## 2015-09-10 DIAGNOSIS — C7951 Secondary malignant neoplasm of bone: Secondary | ICD-10-CM | POA: Diagnosis not present

## 2015-09-10 MED ORDER — HYDROCODONE-ACETAMINOPHEN 7.5-325 MG PO TABS: 1.0000 | ORAL_TABLET | ORAL | Status: DC | PRN

## 2015-09-10 NOTE — Telephone Encounter (Signed)
Pt confirmed and received avs

## 2015-09-10 NOTE — Progress Notes (Signed)
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  PRIOR THERAPY: Systemic chemotherapy with carboplatin for AUC of 5 and Alimta 500 MG/M2 every 3 weeks. First dose 04/16/2015. Status post 6 cycles.   CURRENT THERAPY: None.  INTERVAL HISTORY: Derek Howard 75 y.o. male returns to the clinic today for follow-up visit accompanied by his wife. The patient has been tolerating his systemic chemotherapy with carboplatin and Alimta fairly well with no significant adverse effects except for persistent low back pain. He denied having any significant chest pain, but shortness of breath only with exertion and no cough or hemoptysis. He has no fever or chills. He has no nausea or vomiting. He denied having any significant weight loss or night sweats. He had repeat CT scan of the chest, abdomen and pelvis performed recently and he is here for evaluation and discussion of his scan results.  MEDICAL HISTORY: Past Medical History  Diagnosis Date  . Hearing loss   . Lung mass 03/10/2015  . Alcohol abuse     stopped 2013  . Cancer ([place])     basal cell carcinoma  . Hypertension   . Encounter for antineoplastic chemotherapy 05/28/2015    ALLERGIES:  has No Known Allergies.  MEDICATIONS:  Current Outpatient Prescriptions  Medication Sig Dispense Refill  . dexamethasone (DECADRON) 4 MG tablet 4 mg by mouth twice a day the day before, day of and day after chemotherapy every 3 weeks 40 tablet 0  . folic acid (FOLVITE) 1 MG tablet Take 1 tablet (1 mg total) by mouth daily. 30 tablet 4  . hydrochlorothiazide (MICROZIDE) 12.5 MG capsule Take 1 capsule (12.5 mg total) by mouth  daily. 30 capsule 4  . HYDROcodone-acetaminophen (NORCO) 7.5-325 MG tablet Take 1 tablet by mouth every 4 (four) hours as needed. 15 tablet 0  . prochlorperazine (COMPAZINE) 10 MG tablet Take 1 tablet (10 mg total) by mouth every 6 (six) hours as needed for nausea or vomiting. (Patient not taking: Reported on 07/16/2015) 30 tablet 0   No current facility-administered medications for this visit.    SURGICAL HISTORY:  Past Surgical History  Procedure Laterality Date  . Hernia repair    . Video bronchoscopy with endobronchial navigation N/A 03/27/2015    Procedure: VIDEO BRONCHOSCOPY WITH ENDOBRONCHIAL NAVIGATION;  Surgeon: Melrose Nakayama, MD;  Location: [facility];  Service: Thoracic;  Laterality: N/A;  . Video bronchoscopy with endobronchial ultrasound N/A 03/27/2015    Procedure: VIDEO BRONCHOSCOPY WITH ENDOBRONCHIAL ULTRASOUND;  Surgeon: Melrose Nakayama, MD;  Location: [facility];  Service: Thoracic;  Laterality: N/A;    REVIEW OF SYSTEMS:  Constitutional: positive for fatigue Eyes: negative Ears, nose, mouth, throat, and face: negative Respiratory: positive for dyspnea on exertion Cardiovascular: negative Gastrointestinal: negative Genitourinary:negative Integument/breast: negative Hematologic/lymphatic: negative Musculoskeletal:positive for back pain Neurological: negative Behavioral/Psych: negative Endocrine: negative Allergic/Immunologic: negative   PHYSICAL EXAMINATION: General appearance: alert, cooperative, fatigued and no distress Head: Normocephalic, without obvious abnormality, atraumatic Neck: no adenopathy, no JVD, supple, symmetrical, trachea midline and thyroid not enlarged, symmetric, no tenderness/mass/nodules Lymph nodes: Cervical, supraclavicular, and axillary nodes normal. Resp: clear to auscultation bilaterally Back: symmetric, no curvature. ROM normal. No CVA tenderness. Cardio: regular rate and  rhythm, S1, S2 normal, no murmur, click, rub or gallop GI: soft,  non-tender; bowel sounds normal; no masses,  no organomegaly Extremities: extremities normal, atraumatic, no cyanosis or edema Neurologic: Alert and oriented X 3, normal strength and tone. Normal symmetric reflexes. Normal coordination and gait  ECOG PERFORMANCE STATUS: 1 - Symptomatic but completely ambulatory  Blood pressure 175/66, pulse 96, temperature 98.4 F (36.9 C), temperature source Oral, resp. rate 17, height '5\' 6"'$  (1.676 m), weight 154 lb 9.6 oz (70.126 kg), SpO2 99 %.  LABORATORY DATA: Lab Results  Component Value Date   WBC 7.9 09/03/2015   HGB 11.5* 09/03/2015   HCT 35.3* 09/03/2015   MCV 85.7 09/03/2015   PLT 251 09/03/2015      Chemistry      Component Value Date/Time   NA 142 09/03/2015 1022   NA 140 03/27/2015 0721   K 3.3* 09/03/2015 1022   K 3.2* 03/27/2015 0721   CL 101 03/27/2015 0721   CO2 29 09/03/2015 1022   CO2 25 03/27/2015 0721   BUN 22.8 09/03/2015 1022   BUN 19 03/27/2015 0721   CREATININE 0.9 09/03/2015 1022   CREATININE 0.98 03/27/2015 0721   CREATININE 0.82 02/23/2015 2004      Component Value Date/Time   CALCIUM 9.2 09/03/2015 1022   CALCIUM 8.9 03/27/2015 0721   ALKPHOS 57 09/03/2015 1022   ALKPHOS 54 03/27/2015 0721   AST 19 09/03/2015 1022   AST 21 03/27/2015 0721   ALT 15 09/03/2015 1022   ALT 21 03/27/2015 0721   BILITOT 0.36 09/03/2015 1022   BILITOT 0.5 03/27/2015 0721       RADIOGRAPHIC STUDIES: Ct Chest W Contrast  09/03/2015  CLINICAL DATA:  75 year old male presents for restaging of stage IV non-small cell lung carcinoma of the right upper lobe diagnosed February 2017, completed chemotherapy June 2017. EXAM: CT CHEST, ABDOMEN, AND PELVIS WITH CONTRAST TECHNIQUE: Multidetector CT imaging of the chest, abdomen and pelvis was performed following the standard protocol during bolus administration of intravenous contrast. CONTRAST:  140m ISOVUE-300 IOPAMIDOL (ISOVUE-300) INJECTION 61% COMPARISON:  06/22/2015 CT chest,  abdomen and pelvis. FINDINGS: CT CHEST Mediastinum/Nodes: Normal heart size. No significant pericardial fluid/thickening. Coronary atherosclerosis. Atherosclerotic nonaneurysmal thoracic aorta. Normal caliber pulmonary arteries. No central pulmonary emboli. No discrete thyroid nodules. Unremarkable esophagus. Mildly enlarged 1.0 cm right paratracheal node (series 2/image 20), previously 1.1 cm, not appreciably changed. Separate mildly enlarged 1.2 cm right lower paratracheal node (series 2/ image 23), previously 1.2 cm, unchanged. No additional pathologically enlarged axillary, mediastinal or hilar nodes. Lungs/Pleura: No pneumothorax. No pleural effusion. Central right upper lobe spiculated 3.9 x 3.5 cm lung mass (series 6/ image 40), previously 3.7 x 3.1 cm, mildly increased in size, abutting the medial pleura. Moderate centrilobular emphysema and diffuse bronchial wall thickening. Subpleural anterior right middle lobe 4 mm pulmonary nodule (series 6/image 83) is stable since 03/05/2015. No acute consolidative airspace disease or new significant pulmonary nodules. Stable mild compressive atelectasis in the medial left lower lobe. Musculoskeletal: Lytic posterior left sixth rib 3.5 cm metastasis (series 6/image 40) is mildly increased from 3.1 cm. No new focal bone lesions in the chest. Moderate thoracic spondylosis. CT ABDOMEN AND PELVIS Hepatobiliary: Simple 2.2 cm posterior lateral segment left liver lobe cyst. At least 10 subcentimeter hypodense liver lesions scattered throughout the liver are too small to characterize and are not appreciably changed. No new liver lesions. Normal gallbladder with no radiopaque cholelithiasis. No biliary ductal dilatation. Pancreas: Normal, with no  mass or duct dilation. Spleen: Normal size. No mass. Adrenals/Urinary Tract: Normal right adrenal. Left adrenal 1.9 cm nodule is stable since 03/05/2015 and was characterized as a benign adenoma on the 03/26/2015 PET-CT. No  hydronephrosis. Simple 1.2 cm renal cyst in the medial interpolar right kidney. Simple 2.9 cm renal cyst in the interpolar left kidney. Relatively collapsed and grossly normal bladder. Stomach/Bowel: Moderate hiatal hernia. Otherwise grossly normal stomach. Normal caliber small bowel with no small bowel wall thickening. Normal appendix. Mild sigmoid diverticulosis, with no large bowel wall thickening or pericolonic fat stranding. Oral contrast progresses to the rectum. Vascular/Lymphatic: Atherosclerotic nonaneurysmal abdominal aorta. Patent portal, splenic, hepatic and renal veins. No pathologically enlarged lymph nodes in the abdomen or pelvis. Reproductive: Stable moderately enlarged prostate. Other: No pneumoperitoneum, ascites or focal fluid collection. Stable small fat containing right inguinal hernia. Musculoskeletal: No aggressive appearing focal osseous lesions. Mild-to-moderate lumbar spondylosis. IMPRESSION: 1. Interval growth of spiculated central right upper lobe lung mass. 2. Stable mild right paratracheal mediastinal adenopathy. 3. Lytic bone metastasis in the left posterior sixth rib has mildly increased in size. 4. No new sites of metastatic disease in the chest, abdomen or pelvis . 5. Additional findings include aortic atherosclerosis, coronary atherosclerosis, moderate emphysema, left adrenal adenoma, moderate hiatal hernia, mild sigmoid diverticulosis, moderate prostatomegaly and small fat containing right inguinal hernia. Electronically Signed   By: Ilona Sorrel M.D.   On: 09/03/2015 15:19   Ct Abdomen Pelvis W Contrast  09/03/2015  CLINICAL DATA:  75 year old male presents for restaging of stage IV non-small cell lung carcinoma of the right upper lobe diagnosed February 2017, completed chemotherapy June 2017. EXAM: CT CHEST, ABDOMEN, AND PELVIS WITH CONTRAST TECHNIQUE: Multidetector CT imaging of the chest, abdomen and pelvis was performed following the standard protocol during bolus  administration of intravenous contrast. CONTRAST:  175m ISOVUE-300 IOPAMIDOL (ISOVUE-300) INJECTION 61% COMPARISON:  06/22/2015 CT chest, abdomen and pelvis. FINDINGS: CT CHEST Mediastinum/Nodes: Normal heart size. No significant pericardial fluid/thickening. Coronary atherosclerosis. Atherosclerotic nonaneurysmal thoracic aorta. Normal caliber pulmonary arteries. No central pulmonary emboli. No discrete thyroid nodules. Unremarkable esophagus. Mildly enlarged 1.0 cm right paratracheal node (series 2/image 20), previously 1.1 cm, not appreciably changed. Separate mildly enlarged 1.2 cm right lower paratracheal node (series 2/ image 23), previously 1.2 cm, unchanged. No additional pathologically enlarged axillary, mediastinal or hilar nodes. Lungs/Pleura: No pneumothorax. No pleural effusion. Central right upper lobe spiculated 3.9 x 3.5 cm lung mass (series 6/ image 40), previously 3.7 x 3.1 cm, mildly increased in size, abutting the medial pleura. Moderate centrilobular emphysema and diffuse bronchial wall thickening. Subpleural anterior right middle lobe 4 mm pulmonary nodule (series 6/image 83) is stable since 03/05/2015. No acute consolidative airspace disease or new significant pulmonary nodules. Stable mild compressive atelectasis in the medial left lower lobe. Musculoskeletal: Lytic posterior left sixth rib 3.5 cm metastasis (series 6/image 40) is mildly increased from 3.1 cm. No new focal bone lesions in the chest. Moderate thoracic spondylosis. CT ABDOMEN AND PELVIS Hepatobiliary: Simple 2.2 cm posterior lateral segment left liver lobe cyst. At least 10 subcentimeter hypodense liver lesions scattered throughout the liver are too small to characterize and are not appreciably changed. No new liver lesions. Normal gallbladder with no radiopaque cholelithiasis. No biliary ductal dilatation. Pancreas: Normal, with no mass or duct dilation. Spleen: Normal size. No mass. Adrenals/Urinary Tract: Normal right  adrenal. Left adrenal 1.9 cm nodule is stable since 03/05/2015 and was characterized as a benign adenoma on the 03/26/2015 PET-CT. No hydronephrosis.  Simple 1.2 cm renal cyst in the medial interpolar right kidney. Simple 2.9 cm renal cyst in the interpolar left kidney. Relatively collapsed and grossly normal bladder. Stomach/Bowel: Moderate hiatal hernia. Otherwise grossly normal stomach. Normal caliber small bowel with no small bowel wall thickening. Normal appendix. Mild sigmoid diverticulosis, with no large bowel wall thickening or pericolonic fat stranding. Oral contrast progresses to the rectum. Vascular/Lymphatic: Atherosclerotic nonaneurysmal abdominal aorta. Patent portal, splenic, hepatic and renal veins. No pathologically enlarged lymph nodes in the abdomen or pelvis. Reproductive: Stable moderately enlarged prostate. Other: No pneumoperitoneum, ascites or focal fluid collection. Stable small fat containing right inguinal hernia. Musculoskeletal: No aggressive appearing focal osseous lesions. Mild-to-moderate lumbar spondylosis. IMPRESSION: 1. Interval growth of spiculated central right upper lobe lung mass. 2. Stable mild right paratracheal mediastinal adenopathy. 3. Lytic bone metastasis in the left posterior sixth rib has mildly increased in size. 4. No new sites of metastatic disease in the chest, abdomen or pelvis . 5. Additional findings include aortic atherosclerosis, coronary atherosclerosis, moderate emphysema, left adrenal adenoma, moderate hiatal hernia, mild sigmoid diverticulosis, moderate prostatomegaly and small fat containing right inguinal hernia. Electronically Signed   By: Ilona Sorrel M.D.   On: 09/03/2015 15:19    ASSESSMENT AND PLAN: This is a very pleasant 75 years old white male recently diagnosed stage IV non-small cell lung cancer, adenocarcinoma presented with right upper lobe lung mass in addition to mediastinal lymphadenopathy and metastatic disease to the left posterior  sixth rib diagnosed in February 2017. He completed 6 cycles of systemic chemotherapy with carboplatin and Alimta. He tolerated his treatment well with no significant adverse effects.  Unfortunately the recent CT scan of the chest showed mild increase in the central right upper lobe lung mass as well as increase in the size of the lytic bone metastasis. I discussed the scan results with the patient and his wife. I discussed with him several recommendation including palliative care versus second line treatment with immunotherapy versus continuation with maintenance treatment with single agent Alimta and close monitoring of the enlarging lesion. The patient and his wife would like to take time off treatment for the next few months since the are moving and transportation will be an issue. I will arrange for the patient to come back for follow-up visit in 2 months for reevaluation with repeat CT scan of the chest, abdomen and pelvis for restaging of his disease. For pain management he was giving refill of Vicodin. He was advised to call if he has any concerning symptoms in the interval. The patient voices understanding of current disease status and treatment options and is in agreement with the current care plan.  All questions were answered. The patient knows to call the clinic with any problems, questions or concerns. We can certainly see the patient much sooner if necessary.  Disclaimer: This note was dictated with voice recognition software. Similar sounding words can inadvertently be transcribed and may not be corrected upon review.

## 2015-09-11 NOTE — Telephone Encounter (Signed)
Could you advise me on this matter, prior to me contacting the patient?

## 2015-09-24 ENCOUNTER — Encounter: Payer: Self-pay | Admitting: Internal Medicine

## 2015-09-24 ENCOUNTER — Ambulatory Visit: Payer: Commercial Managed Care - HMO | Attending: Internal Medicine | Admitting: Internal Medicine

## 2015-09-24 VITALS — BP 136/69 | HR 88 | Temp 98.4°F | Resp 18 | Ht 67.0 in | Wt 154.0 lb

## 2015-09-24 DIAGNOSIS — R918 Other nonspecific abnormal finding of lung field: Secondary | ICD-10-CM | POA: Insufficient documentation

## 2015-09-24 DIAGNOSIS — Z85828 Personal history of other malignant neoplasm of skin: Secondary | ICD-10-CM | POA: Diagnosis not present

## 2015-09-24 DIAGNOSIS — F1021 Alcohol dependence, in remission: Secondary | ICD-10-CM | POA: Insufficient documentation

## 2015-09-24 DIAGNOSIS — C3490 Malignant neoplasm of unspecified part of unspecified bronchus or lung: Secondary | ICD-10-CM | POA: Diagnosis not present

## 2015-09-24 DIAGNOSIS — F4542 Pain disorder with related psychological factors: Secondary | ICD-10-CM | POA: Diagnosis not present

## 2015-09-24 DIAGNOSIS — G8929 Other chronic pain: Secondary | ICD-10-CM | POA: Insufficient documentation

## 2015-09-24 DIAGNOSIS — I10 Essential (primary) hypertension: Secondary | ICD-10-CM | POA: Diagnosis not present

## 2015-09-24 DIAGNOSIS — M545 Low back pain, unspecified: Secondary | ICD-10-CM

## 2015-09-24 DIAGNOSIS — C7951 Secondary malignant neoplasm of bone: Secondary | ICD-10-CM | POA: Diagnosis not present

## 2015-09-24 DIAGNOSIS — Z79899 Other long term (current) drug therapy: Secondary | ICD-10-CM | POA: Insufficient documentation

## 2015-09-24 MED ORDER — MORPHINE SULFATE ER 30 MG PO TBCR: 30.0000 mg | EXTENDED_RELEASE_TABLET | Freq: Two times a day (BID) | ORAL | 0 refills | Status: AC

## 2015-09-24 NOTE — Progress Notes (Signed)
Patient is here for Body Aches  Patient complains of back pain being present in the upper to lower back. Pain is scaled currently at an 8. Pain is described as aching and constant.  Patient has not taken medication today. Patient has eaten today.

## 2015-09-24 NOTE — Progress Notes (Signed)
Derek Howard, is a 75 y.o. male  UXL:[id number]  ZDG:[id number]  DOB - [birth year]  Chief Complaint  Patient presents with  . Generalized Body Aches        Subjective:   Derek Howard is a 75 y.o. male with history of Stage IV (T2a, N2, M1b) lung cancer diagnosed in February 2017 here today for a follow up visit. He is currently on systemic chemotherapy and he follows up with oncologist. He is complaining of severe back pain especially upper and middle areas. He has been told it is related to his metastatic Lung cancer. He is present today with his wife. He denied having any significant chest pain, shortness of breath, cough or hemoptysis. He has no fever or chills. He has no nausea or vomiting. He denied having any significant weight loss or night sweats. Patient has No headache, No abdominal pain - No Nausea, No new weakness tingling or numbness.  Problem  Lung Cancer Metastatic to Bone ([facility])    ALLERGIES: No Known Allergies  PAST MEDICAL HISTORY: Past Medical History:  Diagnosis Date  . Alcohol abuse    stopped 2013  . Cancer ([facility])    basal cell carcinoma  . Encounter for antineoplastic chemotherapy 05/28/2015  . Hearing loss   . Hypertension   . Lung mass 03/10/2015    MEDICATIONS AT HOME: Prior to Admission medications   Medication Sig Start Date End Date Taking? Authorizing Provider  dexamethasone (DECADRON) 4 MG tablet 4 mg by mouth twice a day the day before, day of and day after chemotherapy every 3 weeks 04/09/15  Yes Curt Bears, MD  hydrochlorothiazide (MICROZIDE) 12.5 MG capsule Take 1 capsule (12.5 mg total) by mouth daily. 02/23/15  Yes Tereasa Coop, PA-C  HYDROcodone-acetaminophen (NORCO) 7.5-325 MG tablet Take 1 tablet by mouth every 4 (four) hours as needed. 09/10/15  Yes Curt Bears, MD  folic acid (FOLVITE) 1 MG tablet Take 1 tablet (1 mg total) by mouth daily. Patient not taking: Reported on 09/24/2015 04/09/15   Curt Bears, MD  morphine (MS  CONTIN) 30 MG 12 hr tablet Take 1 tablet (30 mg total) by mouth every 12 (twelve) hours. 09/24/15   Tresa Garter, MD  prochlorperazine (COMPAZINE) 10 MG tablet Take 1 tablet (10 mg total) by mouth every 6 (six) hours as needed for nausea or vomiting. Patient not taking: Reported on 09/24/2015 04/09/15   Curt Bears, MD     Objective:   Vitals:   09/24/15 1233  BP: 136/69  Pulse: 88  Resp: 18  Temp: 98.4 F (36.9 C)  TempSrc: Oral  SpO2: 95%  Weight: 154 lb (69.9 kg)  Height: '5\' 7"'$  (1.702 m)    Exam General appearance : Awake, alert, not in any distress. Speech Clear. Not toxic looking, walks with cane HEENT: Atraumatic and Normocephalic, pupils equally reactive to light and accomodation Neck: Supple, no JVD. No cervical lymphadenopathy.  Chest: Good air entry bilaterally, no added sounds  CVS: S1 S2 regular, no murmurs.  Abdomen: Bowel sounds present, Non tender and not distended with no gaurding, rigidity or rebound. Extremities: B/L Lower Ext shows no edema, both legs are warm to touch Neurology: Awake alert, and oriented X 3, CN II-XII intact, Non focal Skin: No Rash  Data Review Lab Results  Component Value Date   HGBA1C 5.7 02/23/2015     Assessment & Plan   1. Chronic low back pain  - morphine (MS CONTIN) 30 MG 12 hr tablet; Take 1  tablet (30 mg total) by mouth every 12 (twelve) hours.  Dispense: 60 tablet; Refill: 0  2. Lung cancer metastatic to bone ([facility]) Prescribe - morphine (MS CONTIN) 30 MG 12 hr tablet; Take 1 tablet (30 mg total) by mouth every 12 (twelve) hours.  Dispense: 60 tablet; Refill: 0 - Follow up with Oncologist - Patient may benefit from Palliative care - Will refer after discussing with oncologist  Patient have been counseled extensively about nutrition and exercise  Return in about 6 months (around 03/26/2016) for Routine Follow Up, Follow up Pain and comorbidities.  The patient was given clear instructions to go to ER or return to  medical center if symptoms don't improve, worsen or new problems develop. The patient verbalized understanding. The patient was told to call to get lab results if they haven't heard anything in the next week.   This note has been created with Surveyor, quantity. Any transcriptional errors are unintentional.    [name], MD, [place], [place], [place], [place] and [place], [place]   09/24/2015, 12:49 PM

## 2015-09-24 NOTE — Patient Instructions (Signed)
Lung Cancer Lung cancer occurs when abnormal cells in the lung grow out of control and form a mass (tumor). There are several types of lung cancer. The two most common types are:  Non-small cell. In this type of lung cancer, abnormal cells are larger and grow more slowly than those of small cell lung cancer.  Small cell. In this type of lung cancer, abnormal cells are smaller than those of non-small cell lung cancer. Small cell lung cancer gets worse faster than non-small cell lung cancer. CAUSES  The leading cause of lung cancer is smoking tobacco. The second leading cause is radon exposure. RISK FACTORS  Smoking tobacco.  Exposure to secondhand tobacco smoke.  Exposure to radon gas.  Exposure to asbestos.  Exposure to arsenic in drinking water.  Air pollution.  Family or personal history of lung cancer.  Lung radiation therapy.  Being older than 74 years. SIGNS AND SYMPTOMS  In the early stages, symptoms may not be present. As the cancer progresses, symptoms may include:  A lasting cough, possibly with blood.  Fatigue.  Unexplained weight loss.  Shortness of breath.  Wheezing.  Chest pain.  Loss of appetite. Symptoms of advanced lung cancer include:  Hoarseness.  Bone or joint pain.  Weakness.  Nail problems.  Face or arm swelling.  Paralysis of the face.  Drooping eyelids. DIAGNOSIS  Lung cancer can be identified with a physical exam and with tests such as:  A chest X-ray.  A CT scan.  Blood tests.  A biopsy. After a diagnosis is made, you will have more tests to determine the stage of the cancer. The stages of non-small cell lung cancer are:  Stage 0, also called carcinoma in situ. At this stage, abnormal cells are found in the inner lining of your lung or lungs.  Stage I. At this stage, abnormal cells have grown into a tumor that is no larger than 5 cm across. The cancer has entered the deeper lung tissue but has not yet entered the lymph  nodes or other parts of the body.  Stage II. At this stage, the tumor is 7 cm across or smaller and has entered nearby lymph nodes. Or, the tumor is 5 cm across or smaller and has invaded surrounding tissue but is not found in nearby lymph nodes. There may be more than one tumor present.  Stage III. At this stage, the tumor may be any size. There may be more than one tumor in the lungs. The cancer cells have spread to the lymph nodes and possibly to other organs.  Stage IV. At this stage, there are tumors in both lungs and the cancer has spread to other areas of the body. The stages of small cell lung cancer are:  Limited. At this stage, the cancer is found only on one side of the chest.  Extensive. At this stage, the cancer is in the lungs and in tissues on the other side of the chest. The cancer has spread to other organs or is found in the fluid between the layers of your lungs. TREATMENT  Depending on the type and stage of your lung cancer, you may be treated with:  Surgery. This is done to remove a tumor.  Radiation therapy. This treatment destroys cancer cells using X-rays or other types of radiation.  Chemotherapy. This treatment uses medicines to destroy cancer cells.  Targeted therapy. This treatment aims to destroy only cancer cells instead of all cells as other therapies do. You may  also have a combination of treatments. HOME CARE INSTRUCTIONS   Do not use any tobacco products. This includes cigarettes, chewing tobacco, and electronic cigarettes. If you need help quitting, ask your health care provider.  Take medicines only as directed by your health care provider.  Eat a healthy diet. Work with a dietitian to make sure you are getting the nutrition you need.  Consider joining a support group or seeking counseling to help you cope with the stress of having lung cancer.  Let your cancer specialist (oncologist) know if you are admitted to the hospital.  Keep all follow-up  visits as directed by your health care provider. This is important. SEEK MEDICAL CARE IF:   You lose weight without trying.  You have a persistent cough and wheezing.  You feel short of breath.  You tire easily.  You experience bone or joint pain.  You have difficulty swallowing.  You feel hoarse or notice your voice changing.  Your pain medicine is not helping. SEEK IMMEDIATE MEDICAL CARE IF:   You cough up blood.  You have new breathing problems.  You develop chest pain.  You develop swelling in:  One or both ankles or legs.  Your face, neck, or arms.  You are confused.  You experience paralysis in your face or a drooping eyelid.   This information is not intended to replace advice given to you by your health care provider. Make sure you discuss any questions you have with your health care provider.   Document Released: 05/16/2000 Document Revised: 10/29/2014 Document Reviewed: 06/13/2013 Elsevier Interactive Patient Education 2016 Else[employer] Back Pain, Adult Back pain is very common in adults.The cause of back pain is rarely dangerous and the pain often gets better over time.The cause of your back pain may not be known. Some common causes of back pain include:  Strain of the muscles or ligaments supporting the spine.  Wear and tear (degeneration) of the spinal disks.  Arthritis.  Direct injury to the back. For many people, back pain may return. Since back pain is rarely dangerous, most people can learn to manage this condition on their own. HOME CARE INSTRUCTIONS Watch your back pain for any changes. The following actions may help to lessen any discomfort you are feeling:  Remain active. It is stressful on your back to sit or stand in one place for long periods of time. Do not sit, drive, or stand in one place for more than 30 minutes at a time. Take short walks on even surfaces as soon as you are able.Try to increase the length of time you walk each  day.  Exercise regularly as directed by your health care provider. Exercise helps your back heal faster. It also helps avoid future injury by keeping your muscles strong and flexible.  Do not stay in bed.Resting more than 1-2 days can delay your recovery.  Pay attention to your body when you bend and lift. The most comfortable positions are those that put less stress on your recovering back. Always use proper lifting techniques, including:  Bending your knees.  Keeping the load close to your body.  Avoiding twisting.  Find a comfortable position to sleep. Use a firm mattress and lie on your side with your knees slightly bent. If you lie on your back, put a pillow under your knees.  Avoid feeling anxious or stressed.Stress increases muscle tension and can worsen back pain.It is important to recognize when you are anxious or stressed and learn ways  to manage it, such as with exercise.  Take medicines only as directed by your health care provider. Over-the-counter medicines to reduce pain and inflammation are often the most helpful.Your health care provider may prescribe muscle relaxant drugs.These medicines help dull your pain so you can more quickly return to your normal activities and healthy exercise.  Apply ice to the injured area:  Put ice in a plastic bag.  Place a towel between your skin and the bag.  Leave the ice on for 20 minutes, 2-3 times a day for the first 2-3 days. After that, ice and heat may be alternated to reduce pain and spasms.  Maintain a healthy weight. Excess weight puts extra stress on your back and makes it difficult to maintain good posture. SEEK MEDICAL CARE IF:  You have pain that is not relieved with rest or medicine.  You have increasing pain going down into the legs or buttocks.  You have pain that does not improve in one week.  You have night pain.  You lose weight.  You have a fever or chills. SEEK IMMEDIATE MEDICAL CARE IF:   You  develop new bowel or bladder control problems.  You have unusual weakness or numbness in your arms or legs.  You develop nausea or vomiting.  You develop abdominal pain.  You feel faint.   This information is not intended to replace advice given to you by your health care provider. Make sure you discuss any questions you have with your health care provider.   Document Released: 02/07/2005 Document Revised: 02/28/2014 Document Reviewed: 06/11/2013 Elsevier Interactive Patient Education [employer].

## 2015-11-04 ENCOUNTER — Other Ambulatory Visit: Payer: Self-pay | Admitting: Internal Medicine

## 2015-11-04 DIAGNOSIS — C3491 Malignant neoplasm of unspecified part of right bronchus or lung: Secondary | ICD-10-CM

## 2015-11-11 ENCOUNTER — Other Ambulatory Visit (HOSPITAL_BASED_OUTPATIENT_CLINIC_OR_DEPARTMENT_OTHER): Payer: Commercial Managed Care - HMO

## 2015-11-11 DIAGNOSIS — C3491 Malignant neoplasm of unspecified part of right bronchus or lung: Secondary | ICD-10-CM

## 2015-11-11 LAB — CBC WITH DIFFERENTIAL/PLATELET
BASO%: 0.1 % (ref 0.0–2.0)
BASOS ABS: 0 10*3/uL (ref 0.0–0.1)
EOS%: 1.1 % (ref 0.0–7.0)
Eosinophils Absolute: 0.1 10*3/uL (ref 0.0–0.5)
HEMATOCRIT: 30.9 % — AB (ref 38.4–49.9)
HEMOGLOBIN: 9.5 g/dL — AB (ref 13.0–17.1)
LYMPH#: 1.7 10*3/uL (ref 0.9–3.3)
LYMPH%: 23.6 % (ref 14.0–49.0)
MCH: 25.1 pg — AB (ref 27.2–33.4)
MCHC: 30.7 g/dL — AB (ref 32.0–36.0)
MCV: 81.7 fL (ref 79.3–98.0)
MONO#: 0.9 10*3/uL (ref 0.1–0.9)
MONO%: 12.1 % (ref 0.0–14.0)
NEUT#: 4.6 10*3/uL (ref 1.5–6.5)
NEUT%: 63.1 % (ref 39.0–75.0)
Platelets: 289 10*3/uL (ref 140–400)
RBC: 3.78 10*6/uL — ABNORMAL LOW (ref 4.20–5.82)
RDW: 15.7 % — AB (ref 11.0–14.6)
WBC: 7.3 10*3/uL (ref 4.0–10.3)

## 2015-11-11 LAB — COMPREHENSIVE METABOLIC PANEL
ALBUMIN: 2.9 g/dL — AB (ref 3.5–5.0)
ALT: 12 U/L (ref 0–55)
AST: 12 U/L (ref 5–34)
Alkaline Phosphatase: 57 U/L (ref 40–150)
Anion Gap: 9 mEq/L (ref 3–11)
BUN: 23.5 mg/dL (ref 7.0–26.0)
CALCIUM: 9.2 mg/dL (ref 8.4–10.4)
CHLORIDE: 107 meq/L (ref 98–109)
CO2: 25 mEq/L (ref 22–29)
CREATININE: 0.9 mg/dL (ref 0.7–1.3)
EGFR: 82 mL/min/{1.73_m2} — ABNORMAL LOW (ref >90)
GLUCOSE: 103 mg/dL (ref 70–140)
POTASSIUM: 4.1 meq/L (ref 3.5–5.1)
SODIUM: 141 meq/L (ref 136–145)
Total Bilirubin: 0.35 mg/dL (ref 0.20–1.20)
Total Protein: 7.1 g/dL (ref 6.4–8.3)

## 2015-11-19 ENCOUNTER — Telehealth: Payer: Self-pay | Admitting: Internal Medicine

## 2015-11-19 ENCOUNTER — Encounter (HOSPITAL_COMMUNITY): Payer: Self-pay

## 2015-11-19 ENCOUNTER — Encounter: Payer: Self-pay | Admitting: Internal Medicine

## 2015-11-19 ENCOUNTER — Ambulatory Visit (HOSPITAL_COMMUNITY)
Admission: RE | Admit: 2015-11-19 | Discharge: 2015-11-19 | Disposition: A | Discharge status: Home / Self Care | Payer: Commercial Managed Care - HMO | Source: Ambulatory Visit | Attending: Internal Medicine | Admitting: Internal Medicine

## 2015-11-19 ENCOUNTER — Other Ambulatory Visit: Payer: Self-pay | Admitting: Medical Oncology

## 2015-11-19 ENCOUNTER — Ambulatory Visit (HOSPITAL_BASED_OUTPATIENT_CLINIC_OR_DEPARTMENT_OTHER): Payer: Commercial Managed Care - HMO | Admitting: Internal Medicine

## 2015-11-19 VITALS — BP 150/62 | HR 87 | Temp 98.6°F | Resp 17 | Ht 67.0 in | Wt 152.7 lb

## 2015-11-19 DIAGNOSIS — R590 Localized enlarged lymph nodes: Secondary | ICD-10-CM | POA: Diagnosis not present

## 2015-11-19 DIAGNOSIS — N400 Enlarged prostate without lower urinary tract symptoms: Secondary | ICD-10-CM | POA: Diagnosis not present

## 2015-11-19 DIAGNOSIS — M549 Dorsalgia, unspecified: Secondary | ICD-10-CM | POA: Diagnosis not present

## 2015-11-19 DIAGNOSIS — C3491 Malignant neoplasm of unspecified part of right bronchus or lung: Secondary | ICD-10-CM

## 2015-11-19 DIAGNOSIS — C7951 Secondary malignant neoplasm of bone: Secondary | ICD-10-CM | POA: Diagnosis not present

## 2015-11-19 DIAGNOSIS — M899 Disorder of bone, unspecified: Secondary | ICD-10-CM | POA: Diagnosis not present

## 2015-11-19 DIAGNOSIS — R0609 Other forms of dyspnea: Secondary | ICD-10-CM | POA: Diagnosis not present

## 2015-11-19 DIAGNOSIS — Z5111 Encounter for antineoplastic chemotherapy: Secondary | ICD-10-CM | POA: Insufficient documentation

## 2015-11-19 DIAGNOSIS — M545 Low back pain: Secondary | ICD-10-CM | POA: Diagnosis not present

## 2015-11-19 DIAGNOSIS — R918 Other nonspecific abnormal finding of lung field: Secondary | ICD-10-CM | POA: Diagnosis not present

## 2015-11-19 DIAGNOSIS — Z85118 Personal history of other malignant neoplasm of bronchus and lung: Secondary | ICD-10-CM | POA: Diagnosis not present

## 2015-11-19 DIAGNOSIS — C3411 Malignant neoplasm of upper lobe, right bronchus or lung: Secondary | ICD-10-CM | POA: Diagnosis not present

## 2015-11-19 DIAGNOSIS — C3490 Malignant neoplasm of unspecified part of unspecified bronchus or lung: Secondary | ICD-10-CM

## 2015-11-19 MED ORDER — HYDROCODONE-ACETAMINOPHEN 7.5-325 MG PO TABS: 1.0000 | ORAL_TABLET | ORAL | 0 refills | Status: DC | PRN

## 2015-11-19 MED ORDER — IOPAMIDOL (ISOVUE-300) INJECTION 61%
100.0000 mL | Freq: Once | INTRAVENOUS | Status: AC | PRN
  Administered 2015-11-19: 100 mL via INTRAVENOUS

## 2015-11-19 NOTE — Telephone Encounter (Signed)
Gave patient avs report and appointments for October. Per 9/28 los lab/fu 10/17.

## 2015-11-19 NOTE — Progress Notes (Addendum)
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  PRIOR THERAPY: Systemic chemotherapy with carboplatin for AUC of 5 and Alimta 500 MG/M2 every 3 weeks. First dose 04/16/2015. Status post 6 cycles.  CURRENT THERAPY: None.  INTERVAL HISTORY: Derek Howard 75 y.o. male returns to the clinic today for follow-up visit accompanied by his wife. The patient completed 6 cycles of systemic chemotherapy with carboplatin and Alimta and tolerated it fairly well with no significant adverse effects. He is feeling fine today was no specific complaints except for mild low back pain. He denied having any significant chest pain, but shortness of breath only with exertion and no cough or hemoptysis. He has no fever or chills. He has no nausea or vomiting. He denied having any significant weight loss or night sweats. Has been observation for the last few months and he was supposed to have repeat CT scan of the chest, abdomen and pelvis before this visit but unfortunately this was not done.  MEDICAL HISTORY: Past Medical History:  Diagnosis Date  . Alcohol abuse    stopped 2013  . Cancer ([facility])    basal cell carcinoma  . Encounter for antineoplastic chemotherapy 05/28/2015  . Hearing loss   . Hypertension   . Lung mass 03/10/2015    ALLERGIES:  has No Known Allergies.  MEDICATIONS:  Current Outpatient Prescriptions  Medication Sig Dispense Refill  . dexamethasone (DECADRON) 4 MG tablet 4 mg by mouth twice a day the day before, day of and day after chemotherapy every 3 weeks 40 tablet 0  . folic acid (FOLVITE) 1 MG tablet Take 1 tablet (1 mg total) by mouth daily. (Patient  not taking: Reported on 09/24/2015) 30 tablet 4  . hydrochlorothiazide (MICROZIDE) 12.5 MG capsule Take 1 capsule (12.5 mg total) by mouth daily. 30 capsule 4  . HYDROcodone-acetaminophen (NORCO) 7.5-325 MG tablet Take 1 tablet by mouth every 4 (four) hours as needed. 40 tablet 0  . morphine (MS CONTIN) 30 MG 12 hr tablet Take 1 tablet (30 mg total) by mouth every 12 (twelve) hours. 60 tablet 0  . prochlorperazine (COMPAZINE) 10 MG tablet Take 1 tablet (10 mg total) by mouth every 6 (six) hours as needed for nausea or vomiting. (Patient not taking: Reported on 09/24/2015) 30 tablet 0   No current facility-administered medications for this visit.     SURGICAL HISTORY:  Past Surgical History:  Procedure Laterality Date  . HERNIA REPAIR    . VIDEO BRONCHOSCOPY WITH ENDOBRONCHIAL NAVIGATION N/A 03/27/2015   Procedure: VIDEO BRONCHOSCOPY WITH ENDOBRONCHIAL NAVIGATION;  Surgeon: Melrose Nakayama, MD;  Location: [facility];  Service: Thoracic;  Laterality: N/A;  . VIDEO BRONCHOSCOPY WITH ENDOBRONCHIAL ULTRASOUND N/A 03/27/2015   Procedure: VIDEO BRONCHOSCOPY WITH ENDOBRONCHIAL ULTRASOUND;  Surgeon: Melrose Nakayama, MD;  Location: [facility];  Service: Thoracic;  Laterality: N/A;    REVIEW OF SYSTEMS:  A comprehensive review of systems was negative except for: Constitutional: positive for fatigue Respiratory: positive for dyspnea on exertion Musculoskeletal: positive for back pain   PHYSICAL EXAMINATION: General appearance: alert, cooperative, fatigued and no distress Head: Normocephalic, without obvious abnormality, atraumatic Neck: no adenopathy, no JVD, supple, symmetrical, trachea midline and thyroid not  enlarged, symmetric, no tenderness/mass/nodules Lymph nodes: Cervical, supraclavicular, and axillary nodes normal. Resp: clear to auscultation bilaterally Back: symmetric, no curvature. ROM normal. No CVA tenderness. Cardio: regular rate and rhythm, S1, S2 normal, no murmur, click, rub or gallop GI:  soft, non-tender; bowel sounds normal; no masses,  no organomegaly Extremities: extremities normal, atraumatic, no cyanosis or edema Neurologic: Alert and oriented X 3, normal strength and tone. Normal symmetric reflexes. Normal coordination and gait  ECOG PERFORMANCE STATUS: 1 - Symptomatic but completely ambulatory  Blood pressure (!) 150/62, pulse 87, temperature 98.6 F (37 C), temperature source Oral, resp. rate 17, height '5\' 7"'$  (1.702 m), weight 152 lb 11.2 oz (69.3 kg), SpO2 97 %.  LABORATORY DATA: Lab Results  Component Value Date   WBC 7.3 11/11/2015   HGB 9.5 (L) 11/11/2015   HCT 30.9 (L) 11/11/2015   MCV 81.7 11/11/2015   PLT 289 11/11/2015      Chemistry      Component Value Date/Time   NA 141 11/11/2015 1022   K 4.1 11/11/2015 1022   CL 101 03/27/2015 0721   CO2 25 11/11/2015 1022   BUN 23.5 11/11/2015 1022   CREATININE 0.9 11/11/2015 1022      Component Value Date/Time   CALCIUM 9.2 11/11/2015 1022   ALKPHOS 57 11/11/2015 1022   AST 12 11/11/2015 1022   ALT 12 11/11/2015 1022   BILITOT 0.35 11/11/2015 1022       RADIOGRAPHIC STUDIES: No results found.  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 He completed 6 cycles of systemic chemotherapy with carboplatin and Alimta. He tolerated his treatment well with no significant adverse effects.  Unfortunately the recent CT scan of the chest showed mild increase in the central right upper lobe lung mass as well as increase in the size of the lytic bone metastasis. I discussed the scan results with the patient and his wife. I discussed with him several recommendation including palliative care versus second line treatment with immunotherapy versus  continuation with maintenance treatment with single agent Alimta and close monitoring of the enlarging lesion. The patient and his wife would like to take time off treatment for the next few months since the are moving and transportation will be an issue. He has been observation for the last 2 months and he was supposed to have repeat CT scan of the chest, abdomen and pelvis before this visit but this was not performed. We will try to schedule the scan to be performed soon. I will give the patient further recommendation regarding his condition after the scan results are available. We will arrange follow-up visit accordingly. The final report of the scan is not available today but reviewing the images showed some evidence for disease progression. The patient is in the process of moving next week and he would like to delay any decision regarding treatment for few weeks. I will arrange for him to come back for follow-up visit in 2-3 weeks for reevaluation and more detailed discussion of his treatment options. For pain management, I will give him a refill of Vicodin today. He was advised to call if he has any concerning symptoms in the interval. The patient voices understanding of current disease status and treatment options and is in agreement with  the current care plan.  All questions were answered. The patient knows to call the clinic with any problems, questions or concerns. We can certainly see the patient much sooner if necessary.  Disclaimer: This note was dictated with voice recognition software. Similar sounding words can inadvertently be transcribed and may not be corrected upon review.

## 2015-11-19 NOTE — Addendum Note (Signed)
Addended by: Curt Bears on: 11/19/2015 03:01 PM   Modules accepted: Orders

## 2015-11-26 ENCOUNTER — Ambulatory Visit (INDEPENDENT_AMBULATORY_CARE_PROVIDER_SITE_OTHER): Payer: Commercial Managed Care - HMO

## 2015-11-26 ENCOUNTER — Ambulatory Visit (INDEPENDENT_AMBULATORY_CARE_PROVIDER_SITE_OTHER): Payer: Commercial Managed Care - HMO | Admitting: Family Medicine

## 2015-11-26 VITALS — BP 142/66 | HR 92 | Temp 98.9°F | Resp 18 | Wt 150.0 lb

## 2015-11-26 DIAGNOSIS — M25559 Pain in unspecified hip: Secondary | ICD-10-CM

## 2015-11-26 DIAGNOSIS — M545 Low back pain: Secondary | ICD-10-CM

## 2015-11-26 DIAGNOSIS — M546 Pain in thoracic spine: Secondary | ICD-10-CM

## 2015-11-26 DIAGNOSIS — Z85118 Personal history of other malignant neoplasm of bronchus and lung: Secondary | ICD-10-CM

## 2015-11-26 DIAGNOSIS — K5903 Drug induced constipation: Secondary | ICD-10-CM

## 2015-11-26 DIAGNOSIS — G8929 Other chronic pain: Secondary | ICD-10-CM

## 2015-11-26 MED ORDER — GABAPENTIN 100 MG PO CAPS: 300.0000 mg | ORAL_CAPSULE | Freq: Three times a day (TID) | ORAL | 1 refills | Status: DC

## 2015-11-26 MED ORDER — METHYLPREDNISOLONE ACETATE 80 MG/ML IJ SUSP
80.0000 mg | Freq: Once | INTRAMUSCULAR | Status: AC
  Administered 2015-11-26: 80 mg via INTRAMUSCULAR

## 2015-11-26 NOTE — Progress Notes (Signed)
Patient ID: Derek Howard, male    DOB: 05-07-1940  Age: 75 y.o. MRN: [id number]  Chief Complaint  Patient presents with  . Back Pain    low/mid back and left shoulder x 1 year history of lung cancer?    Subjective:   Elderly gentleman, hard of hearing. He was originally diagnosed with lung cancer here and is been under the care of the oncologists. He has been developing more and more back pain of late in the mid thoracic region of his back. He says it hurts terribly, several times worse than his low back pain. He does complain of the low back pain bothering him off a lot also, as well as hurting down in his pelvis and hip areas. He has history of metastatic disease, has been treated with chemotherapy. He is scheduled to see Dr. Earlie Server back in a couple of weeks. He specifically wanted to see a physician and not a PA today. They had lots of questions about cancer which I attempted to field. He wanted to know if he can get a cortisone shot to make them feel better.  Current allergies, medications, problem list, past/family and social histories reviewed.  Objective:  BP (!) 142/66 (BP Location: Right Arm, Cuff Size: Normal)   Pulse 92   Temp 98.9 F (37.2 C) (Oral)   Resp 18   Wt 150 lb (68 kg)   SpO2 97%   BMI 23.49 kg/m   Hard of hearing, somewhat disheveled appearance. Neck has fair range of motion. No major nodes palpated. Chest clear. Heart regular. He is very tender in the mid thoracic spine between the scapula, directly in the midline. The lumbar spine is slightly tender but not nearly as bad. He walks with some difficulty. He complains of his pelvis hurting him but it did not put him through major range of motion for it. She also complains of aching in both arms and both legs and we discussed the possibility that he could have some peripheral neuropathy from major chemotherapy. He takes hydrocodone for pain currently.  Assessment & Plan:   Assessment: 1. Chronic midline thoracic  back pain   2. Chronic midline low back pain without sciatica   3. History of lung cancer   4. Pain in joint involving pelvic region and thigh, unspecified laterality   5. Drug-induced constipation       Plan: Recommend a shot of cortisone just give a little generalized pain relief.  X-rays did not show any metastatic disease to the bones that I could see. Radiology reading is still pending.  See instructions.  Orders Placed This Encounter  Procedures  . DG Lumbar Spine Complete    Standing Status:   Future    Number of Occurrences:   1    Standing Expiration Date:   11/25/2016    Order Specific Question:   Reason for Exam (SYMPTOM  OR DIAGNOSIS REQUIRED)    Answer:   thoracic and low back pain, history metastatic lung cancer    Order Specific Question:   Preferred imaging location?    Answer:   External  . DG Thoracic Spine 2 View    Standing Status:   Future    Number of Occurrences:   1    Standing Expiration Date:   11/25/2016    Order Specific Question:   Reason for Exam (SYMPTOM  OR DIAGNOSIS REQUIRED)    Answer:   thoracic and low back pain, history metastatic lung cancer  Order Specific Question:   Preferred imaging location?    Answer:   External  . DG Pelvis 1-2 Views    Standing Status:   Future    Number of Occurrences:   1    Standing Expiration Date:   11/25/2016    Order Specific Question:   Reason for Exam (SYMPTOM  OR DIAGNOSIS REQUIRED)    Answer:   thoracic and low back pain, history metastatic lung cancer    Order Specific Question:   Preferred imaging location?    Answer:   External    No orders of the defined types were placed in this encounter.        Patient Instructions  PLEASE READ ALL OF THIS CAREFULLY  You will receive a shot of cortisone today to try and give you some relief of pain  Begin taking the gabapentin 100 mg 1 pill daily for 3 days, then twice daily for 3 days, then continue taking 3 times daily. This may take being on it  for about 2 or 3 weeks before it starts giving a little relief of your chronic pain. You may need to have it gradually increased if the pain continues to persist badly  Continue taking your hydrocodone pain pills  I recommend that you take some MiraLAX daily or as needed to try and teach her bowels moving better. If you are less constipated you will not hurt as much.  The pain in the low back and pelvis is primarily from your arthritis.  The pains in your arms and legs may represent a little neuropathy (nerve pain) that comes on sometimes with aging and sometimes can be made worse by having taken your chemotherapy.  Follow-up with Dr. Earlie Server. I suspect some of the pain in the upper back comes from the cancer in your lung. I do not know whether he can offer her a little spot radiation or anything else to give you relief in that area.  If the gabapentin does not seem to be helping, return in one month to see one of the providers here to discuss whether you can be on more of it.    Return in about 1 month (around 12/27/2015).   HOPPER,DAVID, MD 11/26/2015

## 2015-11-26 NOTE — Patient Instructions (Addendum)
PLEASE READ ALL OF THIS CAREFULLY  You will receive a shot of cortisone today to try and give you some relief of pain  Begin taking the gabapentin 100 mg 1 pill daily for 3 days, then twice daily for 3 days, then continue taking 3 times daily. This may take being on it for about 2 or 3 weeks before it starts giving a little relief of your chronic pain. You may need to have it gradually increased if the pain continues to persist badly  Continue taking your hydrocodone pain pills  I recommend that you take some MiraLAX daily or as needed to try and teach her bowels moving better. If you are less constipated you will not hurt as much.  The pain in the low back and pelvis is primarily from your arthritis.  The pains in your arms and legs may represent a little neuropathy (nerve pain) that comes on sometimes with aging and sometimes can be made worse by having taken your chemotherapy.  Follow-up with Dr. Earlie Server. I suspect some of the pain in the upper back comes from the cancer in your lung. I do not know whether he can offer her a little spot radiation or anything else to give you relief in that area.  If the gabapentin does not seem to be helping, return in one month to see one of the providers here to discuss whether you can be on more of it.

## 2015-11-27 ENCOUNTER — Ambulatory Visit: Payer: Commercial Managed Care - HMO

## 2015-12-02 MED ORDER — CYANOCOBALAMIN 1000 MCG/ML IJ SOLN
INTRAMUSCULAR | Status: AC
  Filled 2015-12-02: qty 1

## 2015-12-02 MED ORDER — PALONOSETRON HCL INJECTION 0.25 MG/5ML
INTRAVENOUS | Status: AC
  Filled 2015-12-02: qty 5

## 2015-12-08 ENCOUNTER — Encounter: Payer: Self-pay | Admitting: Internal Medicine

## 2015-12-08 ENCOUNTER — Other Ambulatory Visit (HOSPITAL_BASED_OUTPATIENT_CLINIC_OR_DEPARTMENT_OTHER): Payer: Commercial Managed Care - HMO

## 2015-12-08 ENCOUNTER — Ambulatory Visit (HOSPITAL_BASED_OUTPATIENT_CLINIC_OR_DEPARTMENT_OTHER): Payer: Commercial Managed Care - HMO | Admitting: Internal Medicine

## 2015-12-08 ENCOUNTER — Telehealth: Payer: Self-pay | Admitting: Internal Medicine

## 2015-12-08 ENCOUNTER — Encounter: Payer: Self-pay | Admitting: *Deleted

## 2015-12-08 VITALS — BP 145/59 | HR 73 | Temp 98.3°F | Resp 18 | Ht 67.0 in | Wt 152.3 lb

## 2015-12-08 DIAGNOSIS — K5900 Constipation, unspecified: Secondary | ICD-10-CM | POA: Diagnosis not present

## 2015-12-08 DIAGNOSIS — C7951 Secondary malignant neoplasm of bone: Secondary | ICD-10-CM | POA: Diagnosis not present

## 2015-12-08 DIAGNOSIS — G893 Neoplasm related pain (acute) (chronic): Secondary | ICD-10-CM

## 2015-12-08 DIAGNOSIS — C3491 Malignant neoplasm of unspecified part of right bronchus or lung: Secondary | ICD-10-CM

## 2015-12-08 DIAGNOSIS — C3490 Malignant neoplasm of unspecified part of unspecified bronchus or lung: Secondary | ICD-10-CM

## 2015-12-08 DIAGNOSIS — Z5111 Encounter for antineoplastic chemotherapy: Secondary | ICD-10-CM

## 2015-12-08 DIAGNOSIS — C3411 Malignant neoplasm of upper lobe, right bronchus or lung: Secondary | ICD-10-CM | POA: Diagnosis not present

## 2015-12-08 LAB — COMPREHENSIVE METABOLIC PANEL
ALT: 10 U/L (ref 0–55)
AST: 12 U/L (ref 5–34)
Albumin: 3 g/dL — ABNORMAL LOW (ref 3.5–5.0)
Alkaline Phosphatase: 57 U/L (ref 40–150)
Anion Gap: 8 mEq/L (ref 3–11)
BUN: 21.2 mg/dL (ref 7.0–26.0)
CHLORIDE: 107 meq/L (ref 98–109)
CO2: 24 meq/L (ref 22–29)
Calcium: 8.8 mg/dL (ref 8.4–10.4)
Creatinine: 0.9 mg/dL (ref 0.7–1.3)
EGFR: 83 mL/min/{1.73_m2} — AB (ref >90)
GLUCOSE: 89 mg/dL (ref 70–140)
POTASSIUM: 4.1 meq/L (ref 3.5–5.1)
SODIUM: 139 meq/L (ref 136–145)
TOTAL PROTEIN: 7.1 g/dL (ref 6.4–8.3)
Total Bilirubin: <0.22 mg/dL (ref 0.20–1.20)

## 2015-12-08 LAB — CBC WITH DIFFERENTIAL/PLATELET
BASO%: 0.5 % (ref 0.0–2.0)
Basophils Absolute: 0 10*3/uL (ref 0.0–0.1)
EOS ABS: 0.1 10*3/uL (ref 0.0–0.5)
EOS%: 0.9 % (ref 0.0–7.0)
HCT: 28 % — ABNORMAL LOW (ref 38.4–49.9)
HGB: 8.6 g/dL — ABNORMAL LOW (ref 13.0–17.1)
LYMPH%: 25.6 % (ref 14.0–49.0)
MCH: 23.5 pg — ABNORMAL LOW (ref 27.2–33.4)
MCHC: 30.8 g/dL — ABNORMAL LOW (ref 32.0–36.0)
MCV: 76.4 fL — AB (ref 79.3–98.0)
MONO#: 1.3 10*3/uL — ABNORMAL HIGH (ref 0.1–0.9)
MONO%: 14.8 % — AB (ref 0.0–14.0)
NEUT%: 58.2 % (ref 39.0–75.0)
NEUTROS ABS: 4.9 10*3/uL (ref 1.5–6.5)
Platelets: 330 10*3/uL (ref 140–400)
RBC: 3.66 10*6/uL — AB (ref 4.20–5.82)
RDW: 17.7 % — ABNORMAL HIGH (ref 11.0–14.6)
WBC: 8.5 10*3/uL (ref 4.0–10.3)
lymph#: 2.2 10*3/uL (ref 0.9–3.3)

## 2015-12-08 NOTE — Progress Notes (Signed)
Oncology Nurse Navigator Documentation  Oncology Nurse Navigator Flowsheets 12/08/2015  Navigator Location [facility]  Navigator Encounter Type Clinic/[facility]/I spoke with patient and wife today.  He was confused about his medications.  I looked at the bottles and per prescription instructions, I educated on how to take.  He also stated he was driving while taking medication.  Both Dr. Julien Nordmann and myself spoke to him about not driving and explained the reason for this.  His wife is with him today to drive him home.  Derek Howard is being referred to Hospice today.   Treatment Phase Treatment;Follow-up  Barriers/Navigation Needs Education  Education Other  Interventions Education Method  Education Method Verbal  Acuity Level 2  Acuity Level 2 Educational needs  Time Spent with Patient 30

## 2015-12-08 NOTE — Progress Notes (Signed)
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  PRIOR THERAPY: Systemic chemotherapy with carboplatin for AUC of 5 and Alimta 500 MG/M2 every 3 weeks. First dose 04/16/2015. Status post 6 cycles.  CURRENT THERAPY: None.  INTERVAL HISTORY: Derek Howard 75 y.o. male returns to the clinic today for follow-up visit accompanied by his wife. The patient is feeling fine today with no specific complaints except for mild low back pain and constipation. He is currently on gabapentin and MS Contin. He does not take any medications for constipation. He denied having any significant chest pain, but continues to have shortness of breath only with exertion and no cough or hemoptysis. He has no fever or chills. He has no nausea or vomiting. He denied having any significant weight loss or night sweats. His last CT scan of the chest, abdomen and pelvis showed evidence for disease progression. The patient is here today for evaluation and discussion of his treatment options.  MEDICAL HISTORY: Past Medical History:  Diagnosis Date  . Alcohol abuse    stopped 2013  . Cancer ([place])    basal cell carcinoma  . Encounter for antineoplastic chemotherapy 05/28/2015  . Hearing loss   . Hypertension   . Lung mass 03/10/2015    ALLERGIES:  has No Known Allergies.  MEDICATIONS:  Current Outpatient Prescriptions  Medication Sig Dispense Refill  . dexamethasone (DECADRON) 4 MG tablet 4 mg by mouth twice a day the day before, day of and day after chemotherapy every 3 weeks (Patient not taking: Reported on 11/26/2015) 40 tablet 0  . folic acid (FOLVITE) 1 MG tablet Take 1 tablet (1 mg  total) by mouth daily. (Patient not taking: Reported on 11/26/2015) 30 tablet 4  . gabapentin (NEURONTIN) 100 MG capsule Take 3 capsules (300 mg total) by mouth 3 (three) times daily. 90 capsule 1  . hydrochlorothiazide (MICROZIDE) 12.5 MG capsule Take 1 capsule (12.5 mg total) by mouth daily. (Patient not taking: Reported on 11/26/2015) 30 capsule 4  . HYDROcodone-acetaminophen (NORCO) 7.5-325 MG tablet Take 1 tablet by mouth every 4 (four) hours as needed. 40 tablet 0  . morphine (MS CONTIN) 30 MG 12 hr tablet Take 1 tablet (30 mg total) by mouth every 12 (twelve) hours. (Patient not taking: Reported on 11/26/2015) 60 tablet 0  . prochlorperazine (COMPAZINE) 10 MG tablet Take 1 tablet (10 mg total) by mouth every 6 (six) hours as needed for nausea or vomiting. (Patient not taking: Reported on 11/26/2015) 30 tablet 0   No current facility-administered medications for this visit.     SURGICAL HISTORY:  Past Surgical History:  Procedure Laterality Date  . HERNIA REPAIR    . VIDEO BRONCHOSCOPY WITH ENDOBRONCHIAL NAVIGATION N/A 03/27/2015   Procedure: VIDEO BRONCHOSCOPY WITH ENDOBRONCHIAL NAVIGATION;  Surgeon: Melrose Nakayama, MD;  Location: [facility];  Service: Thoracic;  Laterality: N/A;  . VIDEO BRONCHOSCOPY WITH ENDOBRONCHIAL ULTRASOUND N/A 03/27/2015   Procedure: VIDEO BRONCHOSCOPY WITH ENDOBRONCHIAL ULTRASOUND;  Surgeon: Melrose Nakayama, MD;  Location: [facility];  Service: Thoracic;  Laterality: N/A;    REVIEW OF SYSTEMS:  Constitutional: positive for fatigue and weight loss Eyes: negative Ears, nose, mouth, throat, and face: negative Respiratory: positive  for dyspnea on exertion Cardiovascular: negative Gastrointestinal: positive for constipation Genitourinary:negative Integument/breast: negative Hematologic/lymphatic: negative Musculoskeletal:negative Neurological: negative Behavioral/Psych: negative Endocrine: negative Allergic/Immunologic: negative   PHYSICAL EXAMINATION: General  appearance: alert, cooperative, fatigued and no distress Head: Normocephalic, without obvious abnormality, atraumatic Neck: no adenopathy, no JVD, supple, symmetrical, trachea midline and thyroid not enlarged, symmetric, no tenderness/mass/nodules Lymph nodes: Cervical, supraclavicular, and axillary nodes normal. Resp: clear to auscultation bilaterally Back: symmetric, no curvature. ROM normal. No CVA tenderness. Cardio: regular rate and rhythm, S1, S2 normal, no murmur, click, rub or gallop GI: soft, non-tender; bowel sounds normal; no masses,  no organomegaly Extremities: extremities normal, atraumatic, no cyanosis or edema Neurologic: Alert and oriented X 3, normal strength and tone. Normal symmetric reflexes. Normal coordination and gait  ECOG PERFORMANCE STATUS: 1 - Symptomatic but completely ambulatory  Blood pressure (!) 145/59, pulse 73, temperature 98.3 F (36.8 C), temperature source Oral, resp. rate 18, height '5\' 7"'$  (1.702 m), weight 152 lb 4.8 oz (69.1 kg), SpO2 99 %.  LABORATORY DATA: Lab Results  Component Value Date   WBC 8.5 12/08/2015   HGB 8.6 (L) 12/08/2015   HCT 28.0 (L) 12/08/2015   MCV 76.4 (L) 12/08/2015   PLT 330 12/08/2015      Chemistry      Component Value Date/Time   NA 141 11/11/2015 1022   K 4.1 11/11/2015 1022   CL 101 03/27/2015 0721   CO2 25 11/11/2015 1022   BUN 23.5 11/11/2015 1022   CREATININE 0.9 11/11/2015 1022      Component Value Date/Time   CALCIUM 9.2 11/11/2015 1022   ALKPHOS 57 11/11/2015 1022   AST 12 11/11/2015 1022   ALT 12 11/11/2015 1022   BILITOT 0.35 11/11/2015 1022       RADIOGRAPHIC STUDIES: Dg Thoracic Spine 2 View  Result Date: 11/26/2015 CLINICAL DATA:  He was originally diagnosed with lung cancer here and is been under the care of the oncologists. He has been developing more and more back pain of late in the mid thoracic region of his back. He says it hurts terribly, several times worse than his low back pain.  He does complain of the low back pain bothering him off a lot also, as well as hurting down in his pelvis and hip areas. He has history of metastatic disease EXAM: THORACIC SPINE 2 VIEWS COMPARISON:  None. FINDINGS: No fracture.  No spondylolisthesis. Bones are demineralized. No bone lesion. Specifically, no osteoblastic or osteolytic lesions. There are disc degenerative changes along the mid to lower thoracic spine reflected by mild loss disc height and small endplate osteophytes. Ill-defined right upper lobe mass reflecting the lung carcinoma is similar to the recent prior CT IMPRESSION: 1. No fracture or acute finding. 2. No evidence of metastatic disease its to the thoracic spine. Electronically Signed   By: Lajean Manes M.D.   On: 11/26/2015 16:29   Dg Lumbar Spine Complete  Result Date: 11/26/2015 CLINICAL DATA:  He was originally diagnosed with lung cancer here and is been under the care of the oncologists. He has been developing more and more back pain of late in the mid thoracic region of his back. He says it hurts terribly, several times worse than his low back pain. He does complain of the low back pain bothering him off a lot also, as well as hurting down in his pelvis and hip areas. He has history of metastatic disease EXAM: LUMBAR SPINE - COMPLETE 4+ VIEW COMPARISON:  CT, 11/19/2015  FINDINGS: No fracture.  No spondylolisthesis. No bone lesions.  The bones are demineralized. Mild loss of disc height noted throughout the lumbar spine with small endplate osteophytes. Scattered calcifications noted along the abdominal aorta. IMPRESSION: 1. No fracture or spondylolisthesis.  No acute finding. Electronically Signed   By: Lajean Manes M.D.   On: 11/26/2015 16:27   Dg Pelvis 1-2 Views  Result Date: 11/26/2015 CLINICAL DATA:  He was originally diagnosed with lung cancer here and is been under the care of the oncologists. He has been developing more and more back pain of late in the mid thoracic region  of his back. He says it hurts terribly, several times worse than his low back pain. He does complain of the low back pain bothering him off a lot also, as well as hurting down in his pelvis and hip areas. He has history of metastatic disease EXAM: PELVIS - 1-2 VIEW COMPARISON:  CT, 11/11/2015 FINDINGS: No fracture. There are no osteoblastic or osteolytic lesions. The bones are demineralized. There are advanced arthropathic changes of the left hip with marked superior lateral joint space narrowing, superior acetabular subchondral sclerosis and cystic change and marginal osteophytes. Right hip joint, SI joints and symphysis pubis are normally spaced and aligned. Soft tissue show scattered vascular calcifications but are otherwise unremarkable. IMPRESSION: 1. No fracture or acute finding. 2. No evidence of metastatic disease to the pelvis. 3. Advanced arthropathic changes of the left hip stable from the prior CT. Electronically Signed   By: Lajean Manes M.D.   On: 11/26/2015 16:31   Ct Chest W Contrast  Result Date: 11/19/2015 CLINICAL DATA:  Subsequent treatment strategy for lung carcinoma. Carcinoma diagnosed June 2017. Chemotherapy complete. Shoulder pain and back pain. EXAM: CT CHEST, ABDOMEN, AND PELVIS WITH CONTRAST TECHNIQUE: Multidetector CT imaging of the chest, abdomen and pelvis was performed following the standard protocol during bolus administration of intravenous contrast. CONTRAST:  139m ISOVUE-300 IOPAMIDOL (ISOVUE-300) INJECTION 61% COMPARISON:  CT 09/03/2015 FINDINGS: CT CHEST FINDINGS Cardiovascular: Coronary artery calcification and aortic atherosclerotic calcification. Mediastinum/Nodes: No axillary or supraclavicular adenopathy. Enlarged paratracheal lymph nodes increased. For example RIGHT lower paratracheal lymph node measures 16 mm short axis (image 27, series 2) compared to 12 mm. For no hilar lymphadenopathy. Lungs/Pleura: RIGHT upper lobe mass measures 4.4 by 3.7 cm increased from 3.9  by 3.5 cm. No new pulmonary nodules. Musculoskeletal: Interval enlargement of expansile lesion the posterior medial LEFT sixth rib measuring 3.9 x 2.0 cm increased from 2.8 x 1.6 cm. Lesion extends into the intercostal space may be a source of patient's back pain CT ABDOMEN AND PELVIS FINDINGS Hepatobiliary: Multiple low-attenuation lesions in the liver unchanged from comparison exam. No duct dilatation. Normal gallbladder. Pancreas: Pancreas is normal. No ductal dilatation. No pancreatic inflammation. Spleen: Normal spleen Adrenals/urinary tract: Nodule enlargement of the LEFT adrenal gland is not changed measuring 20 mm. Kidneys ureters and bladder normal. Stomach/Bowel: Large hiatal hernia. The duodenum, small-bowel cecum normal. The colon and rectosigmoid colon are normal. Vascular/Lymphatic: Abdominal aorta is normal caliber with atherosclerotic calcification. There is no retroperitoneal or periportal lymphadenopathy. No pelvic lymphadenopathy. Reproductive: Prostate gland is enlarged.  Gland measures 5.9 cm Other: Small inguinal hernias.  No peritoneal disease. Musculoskeletal: No aggressive osseous lesion in the pelvis or lumbar spine. IMPRESSION: Chest Impression: 1. Interval enlargement of RIGHT upper lobe mass. 2. Interval enlargement of paratracheal mediastinal lymph node. 3. Interval enlargement of expansile lesion in the posterior LEFT sixth rib. Potential source of patient's back pain.  Abdomen / Pelvis Impression: 1. No evidence of metastatic disease in the abdomen pelvis. Electronically Signed   By: Suzy Bouchard M.D.   On: 11/19/2015 16:39   Ct Abdomen Pelvis W Contrast  Result Date: 11/19/2015 CLINICAL DATA:  Subsequent treatment strategy for lung carcinoma. Carcinoma diagnosed June 2017. Chemotherapy complete. Shoulder pain and back pain. EXAM: CT CHEST, ABDOMEN, AND PELVIS WITH CONTRAST TECHNIQUE: Multidetector CT imaging of the chest, abdomen and pelvis was performed following the standard  protocol during bolus administration of intravenous contrast. CONTRAST:  152m ISOVUE-300 IOPAMIDOL (ISOVUE-300) INJECTION 61% COMPARISON:  CT 09/03/2015 FINDINGS: CT CHEST FINDINGS Cardiovascular: Coronary artery calcification and aortic atherosclerotic calcification. Mediastinum/Nodes: No axillary or supraclavicular adenopathy. Enlarged paratracheal lymph nodes increased. For example RIGHT lower paratracheal lymph node measures 16 mm short axis (image 27, series 2) compared to 12 mm. For no hilar lymphadenopathy. Lungs/Pleura: RIGHT upper lobe mass measures 4.4 by 3.7 cm increased from 3.9 by 3.5 cm. No new pulmonary nodules. Musculoskeletal: Interval enlargement of expansile lesion the posterior medial LEFT sixth rib measuring 3.9 x 2.0 cm increased from 2.8 x 1.6 cm. Lesion extends into the intercostal space may be a source of patient's back pain CT ABDOMEN AND PELVIS FINDINGS Hepatobiliary: Multiple low-attenuation lesions in the liver unchanged from comparison exam. No duct dilatation. Normal gallbladder. Pancreas: Pancreas is normal. No ductal dilatation. No pancreatic inflammation. Spleen: Normal spleen Adrenals/urinary tract: Nodule enlargement of the LEFT adrenal gland is not changed measuring 20 mm. Kidneys ureters and bladder normal. Stomach/Bowel: Large hiatal hernia. The duodenum, small-bowel cecum normal. The colon and rectosigmoid colon are normal. Vascular/Lymphatic: Abdominal aorta is normal caliber with atherosclerotic calcification. There is no retroperitoneal or periportal lymphadenopathy. No pelvic lymphadenopathy. Reproductive: Prostate gland is enlarged.  Gland measures 5.9 cm Other: Small inguinal hernias.  No peritoneal disease. Musculoskeletal: No aggressive osseous lesion in the pelvis or lumbar spine. IMPRESSION: Chest Impression: 1. Interval enlargement of RIGHT upper lobe mass. 2. Interval enlargement of paratracheal mediastinal lymph node. 3. Interval enlargement of expansile lesion  in the posterior LEFT sixth rib. Potential source of patient's back pain. Abdomen / Pelvis Impression: 1. No evidence of metastatic disease in the abdomen pelvis. Electronically Signed   By: SSuzy BouchardM.D.   On: 11/19/2015 16:39    ASSESSMENT AND PLAN: This is a very pleasant 75years old white male recently diagnosed stage IV non-small cell lung cancer, adenocarcinoma presented with right upper lobe lung mass in addition to mediastinal lymphadenopathy and metastatic disease to the left posterior sixth rib diagnosed in February 2017. He completed 6 cycles of systemic chemotherapy with carboplatin and Alimta. He tolerated his treatment well with no significant adverse effects.  Unfortunately the recent CT scan of the chest showed mild increase in the central right upper lobe lung mass as well as increase in the size of the lytic bone metastasis. I discussed the scan results with the patient and his wife. I discussed with him several recommendation including palliative care versus second line treatment with immunotherapy versus continuation with maintenance treatment with single agent Alimta and close monitoring of the enlarging lesion. Repeat CT scan of the chest, abdomen and pelvis showed evidence for disease progression. I had a lengthy discussion with the patient and his wife about his current condition and treatment options. I offered him treatment with immunotherapy versus palliative care and hospice referral. The patient and his wife has very poor understanding of the role of systemic therapy and it is very unsafe to  consider the patient for such treatment. I think it is in the best interest of this patient is to consider him for just palliative care and hospice at this point. The patient and his wife agreed to the current plan. For constipation he was advised to start taking MiraLAX. For pain management, he will continue on MS Contin 30 mg by mouth daily 12 hour in addition to gabapentin. I  would see him on as-needed basis at this point. He was advised to call if he has any concerning symptoms in the interval. The patient voices understanding of current disease status and treatment options and is in agreement with the current care plan.  All questions were answered. The patient knows to call the clinic with any problems, questions or concerns. We can certainly see the patient much sooner if necessary.  Disclaimer: This note was dictated with voice recognition software. Similar sounding words can inadvertently be transcribed and may not be corrected upon review.

## 2015-12-08 NOTE — Telephone Encounter (Signed)
No 10/17 los/orders/referrals

## 2015-12-11 ENCOUNTER — Other Ambulatory Visit: Payer: Self-pay | Admitting: *Deleted

## 2015-12-11 DIAGNOSIS — C3490 Malignant neoplasm of unspecified part of unspecified bronchus or lung: Secondary | ICD-10-CM

## 2015-12-11 DIAGNOSIS — C7951 Secondary malignant neoplasm of bone: Principal | ICD-10-CM

## 2015-12-14 ENCOUNTER — Ambulatory Visit (INDEPENDENT_AMBULATORY_CARE_PROVIDER_SITE_OTHER): Payer: Commercial Managed Care - HMO | Admitting: Family Medicine

## 2015-12-14 VITALS — BP 122/72 | HR 84 | Temp 98.2°F | Resp 17 | Ht 65.5 in | Wt 150.0 lb

## 2015-12-14 DIAGNOSIS — G894 Chronic pain syndrome: Secondary | ICD-10-CM | POA: Diagnosis not present

## 2015-12-14 DIAGNOSIS — C3491 Malignant neoplasm of unspecified part of right bronchus or lung: Secondary | ICD-10-CM

## 2015-12-14 MED ORDER — HYDROCODONE-ACETAMINOPHEN 7.5-325 MG PO TABS: 1.0000 | ORAL_TABLET | ORAL | 0 refills | Status: AC | PRN

## 2015-12-14 MED ORDER — HYDROCODONE-ACETAMINOPHEN 7.5-325 MG PO TABS
1.0000 | ORAL_TABLET | ORAL | 0 refills | Status: DC | PRN
Start: 2015-12-14 — End: 2015-12-14

## 2015-12-14 NOTE — Progress Notes (Signed)
Chief Complaint  Patient presents with  . Back Pain    HPI Pt reports that he has been having a year of pain in his hip and back as well as knees He reports that he was unable to get out of bed due to his pain. He reports that he was under the care of Oncologist but was told that they would not   He states that he was referred to Hospice but has not had a referral yet.   He is here with his partner who reports that she goes everywhere with him so that if she were to fall she can catch him up.    Past Medical History:  Diagnosis Date  . Alcohol abuse    stopped 2013  . Cancer ([place])    basal cell carcinoma  . Encounter for antineoplastic chemotherapy 05/28/2015  . Hearing loss   . Hypertension   . Lung mass 03/10/2015    Current Outpatient Prescriptions  Medication Sig Dispense Refill  . gabapentin (NEURONTIN) 100 MG capsule Take 3 capsules (300 mg total) by mouth 3 (three) times daily. 90 capsule 1  . dexamethasone (DECADRON) 4 MG tablet 4 mg by mouth twice a day the day before, day of and day after chemotherapy every 3 weeks (Patient not taking: Reported on 12/14/2015) 40 tablet 0  . folic acid (FOLVITE) 1 MG tablet Take 1 tablet (1 mg total) by mouth daily. (Patient not taking: Reported on 12/14/2015) 30 tablet 4  . hydrochlorothiazide (MICROZIDE) 12.5 MG capsule Take 1 capsule (12.5 mg total) by mouth daily. (Patient not taking: Reported on 12/14/2015) 30 capsule 4  . HYDROcodone-acetaminophen (NORCO) 7.5-325 MG tablet Take 1 tablet by mouth every 4 (four) hours as needed. 60 tablet 0  . morphine (MS CONTIN) 30 MG 12 hr tablet Take 1 tablet (30 mg total) by mouth every 12 (twelve) hours. (Patient not taking: Reported on 12/14/2015) 60 tablet 0  . prochlorperazine (COMPAZINE) 10 MG tablet Take 1 tablet (10 mg total) by mouth every 6 (six) hours as needed for nausea or vomiting. (Patient not taking: Reported on 12/14/2015) 30 tablet 0   No current facility-administered medications  for this visit.     Allergies: No Known Allergies  Past Surgical History:  Procedure Laterality Date  . HERNIA REPAIR    . VIDEO BRONCHOSCOPY WITH ENDOBRONCHIAL NAVIGATION N/A 03/27/2015   Procedure: VIDEO BRONCHOSCOPY WITH ENDOBRONCHIAL NAVIGATION;  Surgeon: Melrose Nakayama, MD;  Location: [facility];  Service: Thoracic;  Laterality: N/A;  . VIDEO BRONCHOSCOPY WITH ENDOBRONCHIAL ULTRASOUND N/A 03/27/2015   Procedure: VIDEO BRONCHOSCOPY WITH ENDOBRONCHIAL ULTRASOUND;  Surgeon: Melrose Nakayama, MD;  Location: [facility];  Service: Thoracic;  Laterality: N/A;    Social History   Social History  . Marital status: Married    Spouse name: N/A  . Number of children: 2  . Years of education: N/A   Social History Main Topics  . Smoking status: Former Smoker    Packs/day: 2.00    Years: 50.00    Types: Cigarettes    Quit date: 03/23/2005  . Smokeless tobacco: Never Used  . Alcohol use No     Comment: stopped drinking 2013  . Drug use: No  . Sexual activity: Not Asked   Other Topics Concern  . None   Social History Narrative   Marital status:  Married x 1979.      Children:  2 children      Lives: with wife, one child (46)  Employment:  Retired      Tobacco:  None; quit       Alcohol:  None          ROS  Objective: Vitals:   12/14/15 1124  BP: 122/72  Pulse: 84  Resp: 17  Temp: 98.2 F (36.8 C)  TempSrc: Oral  SpO2: 97%  Weight: 150 lb (68 kg)  Height: 5' 5.5" (1.664 m)    Physical Exam  Constitutional: He is oriented to person, place, and time. He appears well-developed. No distress.  HENT:  Head: Normocephalic and atraumatic.  Cardiovascular: Normal rate, regular rhythm and normal heart sounds.   Pulmonary/Chest: Effort normal and breath sounds normal. No respiratory distress.  Neurological: He is alert and oriented to person, place, and time.    Assessment and Plan Derek Howard was seen today for back pain.  Diagnoses and all orders for this visit:  Chronic  pain syndrome- patient was referred to hospice Discussed that hospice referral has been placed and hospice will manage pain.   Non-small cell carcinoma of right lung, stage 4 ([place])-  Discussed with patient and spouse that he will need comprehensive pain management and nutritional support. Discussed that Hospice can help coordinate care and services to support the family. gave refill of Norco for pain Pt to call Oncology if not getting relief from Pain since his cancer pain can be managed by Oncology until Hospice is able to take over care -     Discontinue: HYDROcodone-acetaminophen (NORCO) 7.5-325 MG tablet; Take 1 tablet by mouth every 4 (four) hours as needed. -     HYDROcodone-acetaminophen (NORCO) 7.5-325 MG tablet; Take 1 tablet by mouth every 4 (four) hours as needed.  A total of 30 minutes were spent face-to-face with the patient during this encounter and over half of that time was spent on counseling and coordination of care. Spent time explaining to patient the role of hospice and how they can support the family. And reviewed previous providers notes with patient to best address his needs.  This visit was slowed by the fact that the patient has poor insight and is very hard of hearing.   [place]

## 2015-12-14 NOTE — Patient Instructions (Addendum)
  Follow up with Hospice for further pain management.   IF you received an x-ray today, you will receive an invoice from [facility]. Please contact [facility] at [phone] with questions or concerns regarding your invoice.   IF you received labwork today, you will receive an invoice from [facility] [employer]. Please contact [facility] at [phone] with questions or concerns regarding your invoice.   Our billing staff will not be able to assist you with questions regarding bills from these companies.  You will be contacted with the lab results as soon as they are available. The fastest way to get your results is to activate your My Chart account. Instructions are located on the last page of this paperwork. If you have not heard from [place] regarding the results in 2 weeks, please contact this office.

## 2015-12-15 ENCOUNTER — Telehealth: Payer: Self-pay | Admitting: Medical Oncology

## 2015-12-15 NOTE — Telephone Encounter (Signed)
Need phone numbers =given .

## 2015-12-23 ENCOUNTER — Telehealth: Payer: Self-pay | Admitting: *Deleted

## 2015-12-23 NOTE — Telephone Encounter (Signed)
"  Hospice received referral on 12-11-2015.  We've tried patient's home and phone rings and rings.  Called patient's cell and message is that he does not wish to receive incoming calls.  Tried to reach his son not able to leave messages to request return call.  Please let Dr. Julien Nordmann know we've tried to reach this patient unsuccessfully."

## 2015-12-25 ENCOUNTER — Other Ambulatory Visit: Payer: Self-pay | Admitting: Family Medicine

## 2015-12-25 DIAGNOSIS — M545 Low back pain, unspecified: Secondary | ICD-10-CM

## 2015-12-25 DIAGNOSIS — M25559 Pain in unspecified hip: Secondary | ICD-10-CM

## 2015-12-25 DIAGNOSIS — G8929 Other chronic pain: Secondary | ICD-10-CM

## 2015-12-25 DIAGNOSIS — Z85118 Personal history of other malignant neoplasm of bronchus and lung: Secondary | ICD-10-CM

## 2015-12-25 DIAGNOSIS — M546 Pain in thoracic spine: Principal | ICD-10-CM

## 2015-12-26 NOTE — Telephone Encounter (Signed)
11/2015 last ov

## 2015-12-28 ENCOUNTER — Telehealth: Payer: Self-pay | Admitting: *Deleted

## 2015-12-28 NOTE — Telephone Encounter (Signed)
"  Lavella Lemons RN with Hospice [phone] calling to ask Dr. Julien Nordmann if he will refill Gabapentin for this patient.  Her uses this med for nerve pain to his legs.  The ordering provider has retired.  If he thinks patient should take this and will order he uses [facility] on [address]."

## 2015-12-30 ENCOUNTER — Other Ambulatory Visit: Payer: Self-pay | Admitting: Medical Oncology

## 2016-01-29 ENCOUNTER — Other Ambulatory Visit: Payer: Self-pay | Admitting: Internal Medicine

## 2016-01-29 DIAGNOSIS — C3491 Malignant neoplasm of unspecified part of right bronchus or lung: Secondary | ICD-10-CM

## 2016-03-03 ENCOUNTER — Other Ambulatory Visit: Payer: Self-pay | Admitting: Family Medicine

## 2016-03-03 DIAGNOSIS — M546 Pain in thoracic spine: Principal | ICD-10-CM

## 2016-03-03 DIAGNOSIS — M545 Low back pain, unspecified: Secondary | ICD-10-CM

## 2016-03-03 DIAGNOSIS — G8929 Other chronic pain: Secondary | ICD-10-CM

## 2016-03-03 DIAGNOSIS — M25559 Pain in unspecified hip: Secondary | ICD-10-CM

## 2016-03-03 DIAGNOSIS — Z85118 Personal history of other malignant neoplasm of bronchus and lung: Secondary | ICD-10-CM

## 2016-03-06 NOTE — Telephone Encounter (Signed)
12/14/15 last ov

## 2016-10-19 ENCOUNTER — Telehealth: Payer: Self-pay | Admitting: Family Medicine

## 2016-11-06 ENCOUNTER — Encounter (HOSPITAL_COMMUNITY): Payer: Self-pay | Admitting: Emergency Medicine

## 2016-11-06 ENCOUNTER — Emergency Department (HOSPITAL_COMMUNITY)

## 2016-11-06 ENCOUNTER — Inpatient Hospital Stay (HOSPITAL_COMMUNITY)
Admission: EM | Admit: 2016-11-06 | Discharge: 2016-11-21 | DRG: 377 | Disposition: E | Attending: Internal Medicine | Admitting: Internal Medicine

## 2016-11-06 DIAGNOSIS — Z9221 Personal history of antineoplastic chemotherapy: Secondary | ICD-10-CM

## 2016-11-06 DIAGNOSIS — F419 Anxiety disorder, unspecified: Secondary | ICD-10-CM | POA: Diagnosis present

## 2016-11-06 DIAGNOSIS — K922 Gastrointestinal hemorrhage, unspecified: Secondary | ICD-10-CM | POA: Diagnosis not present

## 2016-11-06 DIAGNOSIS — F1011 Alcohol abuse, in remission: Secondary | ICD-10-CM | POA: Diagnosis present

## 2016-11-06 DIAGNOSIS — Z85828 Personal history of other malignant neoplasm of skin: Secondary | ICD-10-CM | POA: Diagnosis not present

## 2016-11-06 DIAGNOSIS — K920 Hematemesis: Secondary | ICD-10-CM | POA: Diagnosis present

## 2016-11-06 DIAGNOSIS — R4781 Slurred speech: Secondary | ICD-10-CM | POA: Diagnosis not present

## 2016-11-06 DIAGNOSIS — R748 Abnormal levels of other serum enzymes: Secondary | ICD-10-CM | POA: Diagnosis present

## 2016-11-06 DIAGNOSIS — E875 Hyperkalemia: Secondary | ICD-10-CM | POA: Diagnosis present

## 2016-11-06 DIAGNOSIS — Z79899 Other long term (current) drug therapy: Secondary | ICD-10-CM | POA: Diagnosis not present

## 2016-11-06 DIAGNOSIS — Z87891 Personal history of nicotine dependence: Secondary | ICD-10-CM | POA: Diagnosis not present

## 2016-11-06 DIAGNOSIS — Z66 Do not resuscitate: Secondary | ICD-10-CM | POA: Diagnosis present

## 2016-11-06 DIAGNOSIS — Z7189 Other specified counseling: Secondary | ICD-10-CM | POA: Diagnosis not present

## 2016-11-06 DIAGNOSIS — R932 Abnormal findings on diagnostic imaging of liver and biliary tract: Secondary | ICD-10-CM | POA: Diagnosis not present

## 2016-11-06 DIAGNOSIS — H9190 Unspecified hearing loss, unspecified ear: Secondary | ICD-10-CM | POA: Diagnosis present

## 2016-11-06 DIAGNOSIS — R1111 Vomiting without nausea: Secondary | ICD-10-CM | POA: Diagnosis not present

## 2016-11-06 DIAGNOSIS — C3490 Malignant neoplasm of unspecified part of unspecified bronchus or lung: Secondary | ICD-10-CM

## 2016-11-06 DIAGNOSIS — I10 Essential (primary) hypertension: Secondary | ICD-10-CM | POA: Diagnosis present

## 2016-11-06 DIAGNOSIS — C7951 Secondary malignant neoplasm of bone: Secondary | ICD-10-CM | POA: Diagnosis present

## 2016-11-06 DIAGNOSIS — Z79891 Long term (current) use of opiate analgesic: Secondary | ICD-10-CM | POA: Diagnosis not present

## 2016-11-06 DIAGNOSIS — K921 Melena: Principal | ICD-10-CM

## 2016-11-06 DIAGNOSIS — N170 Acute kidney failure with tubular necrosis: Secondary | ICD-10-CM

## 2016-11-06 DIAGNOSIS — I6789 Other cerebrovascular disease: Secondary | ICD-10-CM | POA: Diagnosis not present

## 2016-11-06 DIAGNOSIS — N179 Acute kidney failure, unspecified: Secondary | ICD-10-CM | POA: Diagnosis not present

## 2016-11-06 DIAGNOSIS — Z515 Encounter for palliative care: Secondary | ICD-10-CM

## 2016-11-06 DIAGNOSIS — N62 Hypertrophy of breast: Secondary | ICD-10-CM

## 2016-11-06 DIAGNOSIS — N19 Unspecified kidney failure: Secondary | ICD-10-CM

## 2016-11-06 LAB — BASIC METABOLIC PANEL
Anion gap: 6 (ref 5–15)
BUN: 105 mg/dL — ABNORMAL HIGH (ref 6–20)
CHLORIDE: 105 mmol/L (ref 101–111)
CO2: 34 mmol/L — ABNORMAL HIGH (ref 22–32)
Calcium: 8.1 mg/dL — ABNORMAL LOW (ref 8.9–10.3)
Creatinine, Ser: 2.42 mg/dL — ABNORMAL HIGH (ref 0.61–1.24)
GFR calc non Af Amer: 24 mL/min — ABNORMAL LOW (ref >60)
GFR, EST AFRICAN AMERICAN: 28 mL/min — AB (ref >60)
Glucose, Bld: 102 mg/dL — ABNORMAL HIGH (ref 65–99)
POTASSIUM: 5.1 mmol/L (ref 3.5–5.1)
SODIUM: 145 mmol/L (ref 135–145)

## 2016-11-06 LAB — COMPREHENSIVE METABOLIC PANEL
ALK PHOS: 194 U/L — AB (ref 38–126)
ALT: 365 U/L — ABNORMAL HIGH (ref 17–63)
ANION GAP: 17 — AB (ref 5–15)
AST: 421 U/L — ABNORMAL HIGH (ref 15–41)
Albumin: 2.6 g/dL — ABNORMAL LOW (ref 3.5–5.0)
BUN: 116 mg/dL — ABNORMAL HIGH (ref 6–20)
CHLORIDE: 99 mmol/L — AB (ref 101–111)
CO2: 24 mmol/L (ref 22–32)
Calcium: 8 mg/dL — ABNORMAL LOW (ref 8.9–10.3)
Creatinine, Ser: 3.04 mg/dL — ABNORMAL HIGH (ref 0.61–1.24)
GFR calc Af Amer: 21 mL/min — ABNORMAL LOW (ref >60)
GFR calc non Af Amer: 18 mL/min — ABNORMAL LOW (ref >60)
Glucose, Bld: 97 mg/dL (ref 65–99)
POTASSIUM: 5.5 mmol/L — AB (ref 3.5–5.1)
SODIUM: 140 mmol/L (ref 135–145)
Total Bilirubin: 1 mg/dL (ref 0.3–1.2)
Total Protein: 6 g/dL — ABNORMAL LOW (ref 6.5–8.1)

## 2016-11-06 LAB — CBC WITH DIFFERENTIAL/PLATELET
BASOS ABS: 0 10*3/uL (ref 0.0–0.1)
Basophils Relative: 0 %
EOS ABS: 0 10*3/uL (ref 0.0–0.7)
Eosinophils Relative: 0 %
HCT: 17.6 % — ABNORMAL LOW (ref 39.0–52.0)
HEMOGLOBIN: 4.3 g/dL — AB (ref 13.0–17.0)
LYMPHS PCT: 9 %
Lymphs Abs: 0.7 10*3/uL (ref 0.7–4.0)
MCH: 16.7 pg — ABNORMAL LOW (ref 26.0–34.0)
MCHC: 24.4 g/dL — AB (ref 30.0–36.0)
MCV: 68.5 fL — ABNORMAL LOW (ref 78.0–100.0)
MONO ABS: 0.8 10*3/uL (ref 0.1–1.0)
Monocytes Relative: 10 %
NEUTROS ABS: 6 10*3/uL (ref 1.7–7.7)
Neutrophils Relative %: 81 %
PLATELETS: 354 10*3/uL (ref 150–400)
RBC: 2.57 MIL/uL — ABNORMAL LOW (ref 4.22–5.81)
RDW: 23.9 % — AB (ref 11.5–15.5)
WBC: 7.5 10*3/uL (ref 4.0–10.5)

## 2016-11-06 LAB — HEMOGLOBIN AND HEMATOCRIT, BLOOD
HEMATOCRIT: 20.7 % — AB (ref 39.0–52.0)
HEMOGLOBIN: 5.6 g/dL — AB (ref 13.0–17.0)

## 2016-11-06 LAB — PROTIME-INR
INR: 2.16
PROTHROMBIN TIME: 23.9 s — AB (ref 11.4–15.2)

## 2016-11-06 LAB — ABO/RH: ABO/RH(D): A POS

## 2016-11-06 LAB — PREPARE RBC (CROSSMATCH)

## 2016-11-06 LAB — POC OCCULT BLOOD, ED: Fecal Occult Bld: POSITIVE — AB

## 2016-11-06 LAB — AMMONIA: Ammonia: 26 umol/L (ref 9–35)

## 2016-11-06 MED ORDER — SODIUM CHLORIDE 0.9 % IV SOLN: 10.0000 mL/h | Freq: Once | INTRAVENOUS | Status: DC

## 2016-11-06 MED ORDER — PANTOPRAZOLE SODIUM 40 MG IV SOLR
8.0000 mg/h | INTRAVENOUS | Status: DC
  Filled 2016-11-06 (×2): qty 80

## 2016-11-06 MED ORDER — SODIUM CHLORIDE 0.9 % IV SOLN
Freq: Once | INTRAVENOUS | Status: AC
  Administered 2016-11-06: 10 mL/h via INTRAVENOUS

## 2016-11-06 MED ORDER — MORPHINE SULFATE (PF) 2 MG/ML IV SOLN
1.0000 mg | INTRAVENOUS | Status: DC | PRN
  Administered 2016-11-06 – 2016-11-08 (×7): 2 mg via INTRAVENOUS
  Filled 2016-11-06 (×7): qty 1

## 2016-11-06 MED ORDER — SODIUM CHLORIDE 0.9 % IV SOLN
8.0000 mg/h | INTRAVENOUS | Status: DC
  Administered 2016-11-06 – 2016-11-07 (×3): 8 mg/h via INTRAVENOUS
  Filled 2016-11-06 (×8): qty 80

## 2016-11-06 MED ORDER — SODIUM CHLORIDE 0.9 % IV SOLN
80.0000 mg | Freq: Once | INTRAVENOUS | Status: AC
  Administered 2016-11-06: 80 mg via INTRAVENOUS
  Filled 2016-11-06: qty 80

## 2016-11-06 MED ORDER — PANTOPRAZOLE SODIUM 40 MG IV SOLR: 40.0000 mg | Freq: Two times a day (BID) | INTRAVENOUS | Status: DC

## 2016-11-06 MED ORDER — FAMOTIDINE IN NACL 20-0.9 MG/50ML-% IV SOLN
20.0000 mg | Freq: Once | INTRAVENOUS | Status: AC
  Administered 2016-11-06: 20 mg via INTRAVENOUS
  Filled 2016-11-06: qty 50

## 2016-11-06 MED ORDER — SODIUM CHLORIDE 0.9 % IV SOLN
80.0000 mg | Freq: Once | INTRAVENOUS | Status: DC
  Filled 2016-11-06: qty 80

## 2016-11-06 MED ORDER — LORAZEPAM 2 MG/ML IJ SOLN
0.5000 mg | INTRAMUSCULAR | Status: DC | PRN
  Administered 2016-11-06 – 2016-11-07 (×2): 1 mg via INTRAVENOUS
  Filled 2016-11-06 (×2): qty 1

## 2016-11-06 MED ORDER — SODIUM CHLORIDE 0.9 % IV SOLN
1.0000 g | Freq: Once | INTRAVENOUS | Status: DC
  Filled 2016-11-06: qty 10

## 2016-11-06 MED ORDER — SODIUM CHLORIDE 0.9 % IV BOLUS (SEPSIS)
1000.0000 mL | Freq: Once | INTRAVENOUS | Status: AC
  Administered 2016-11-06: 500 mL via INTRAVENOUS

## 2016-11-06 MED ORDER — SODIUM BICARBONATE 8.4 % IV SOLN
50.0000 meq | Freq: Once | INTRAVENOUS | Status: AC
  Administered 2016-11-06: 50 meq via INTRAVENOUS
  Filled 2016-11-06: qty 50

## 2016-11-06 MED ORDER — FUROSEMIDE 10 MG/ML IJ SOLN
20.0000 mg | Freq: Once | INTRAMUSCULAR | Status: AC
  Administered 2016-11-06: 20 mg via INTRAVENOUS
  Filled 2016-11-06: qty 2

## 2016-11-06 MED ORDER — SODIUM CHLORIDE 0.9 % IV SOLN
Freq: Once | INTRAVENOUS | Status: AC
  Administered 2016-11-06: 250 mL via INTRAVENOUS

## 2016-11-06 MED ORDER — DEXTROSE-NACL 5-0.9 % IV SOLN
INTRAVENOUS | Status: DC
  Administered 2016-11-06: 1000 mL via INTRAVENOUS

## 2016-11-06 NOTE — Progress Notes (Addendum)
Nurse paged provider to make him aware of critical hemoglobin 5.6. Waiting for return call.   Dr. Myna Hidalgo returned call and is aware of patients critical hemoglobin value of 5.6.

## 2016-11-06 NOTE — Progress Notes (Signed)
Mr. Derek Howard is an unfortunate 76 year old gentleman with metastatic lung cancer, untreated for the past year, now presenting from home with hematemesis and found to have a hemoglobin in the mid-4 range. He also has new renal and liver failure.   There was some difficulty reaching family initially, and they weren't sure what the patient would want in this situation. Unfortunately Mr. Derek Howard is not in a position to express his wishes clearly at this time.  After discussion with the patient's wife and son at the bedside, they would like to continue with the blood transfusion, but do not want any other aggressive treatments. They agree that there would be no role for CPR, defibrillation, intubation, or ACLS medications.  Family hopes that Mr. Derek Howard can be discharged to a hospice facility. Palliative care has been asked to consult.

## 2016-11-06 NOTE — Progress Notes (Signed)
WL [id number]  [facility] [place] RN Visit.   This is a related and covered GIP admission of 11/10/2016 with [facility] diagnosis of Lung Cancer per Dr. Lyman Speller. Code status DNR. Family activated EMS after notifying [facility]. Patient was experiencing vomiting and diarrhea with blood.   Patient appears to be resting comfortably and did not respond to my voice. Skin is pale and dry. He is on O2 6L [place] sats 99%.  IV right AC. Plan is to infuse 3units PRBC and repeat H&H post infusion.  Family at bedside.   Continuous Medications: Protonix 80mg /224ml @ 8mg /hr IV D5NS 165ml/hr IV  PRN Medications: Lorazepam 0.5-1mg  Q4hrs PRN , Morphine 2mg /ml 1-3mg  IV Q2 hours pain PRN.  Dr. Lyman Speller and Dr. Julien Nordmann notified of admission. Transfer Summary and medication list placed on shadow chart.    [facility] will continue to follow while hospitalized and anticipate needs.   Please call with any hospice related questions.  Thank you.  [facility] Liaison  [phone]   All Hospital Liaisons are on AMION

## 2016-11-06 NOTE — ED Notes (Signed)
[facility] called and advised that pt has a DNR code status. Dr.Palumbo speaking with hospice nurse regarding care planning of patient.

## 2016-11-06 NOTE — ED Notes (Signed)
Spoke with pt spouse and family was uncertain of code status and amount of care to be provided. Spouse advised she would be coming to hospital to speak with MD.

## 2016-11-06 NOTE — H&P (Signed)
History and Physical  KEHINDE BOWDISH WUJ:[id number] DOB: 1940/10/11 DOA: 11/02/2016  PCP:  Forrest Moron, MD   Chief Complaint:  Bleeding   History of Present Illness:  Pt is a 76 yo male with hx of state IV lung cancer s/p chemotherapy in hospice at home for the past few months who was brought here today by family after he started having melena and coffee ground hematemesis this morning with abdominal pain and decreased mental status. The patient is a very poor historian and family said his mental status has been declining gradually and tremendously over the past few months and he can barely recognize them or talk at all for the past couple of weeks. Family denied that the patient has had any other complaints today but they reported he has been vomiting for months (non bloody) and has had abdominal pain for months too.   Review of Systems:  Unable to get due to clinical condition / mental state but pt said yes to having abdominal pain during exam and then said he is cold and wants to sleep.   Past Medical and Surgical History:   Past Medical History:  Diagnosis Date  . Alcohol abuse    stopped 2013  . Cancer ([place])    basal cell carcinoma  . Encounter for antineoplastic chemotherapy 05/28/2015  . Hearing loss   . Hypertension   . Lung mass 03/10/2015   Past Surgical History:  Procedure Laterality Date  . HERNIA REPAIR    . VIDEO BRONCHOSCOPY WITH ENDOBRONCHIAL NAVIGATION N/A 03/27/2015   Procedure: VIDEO BRONCHOSCOPY WITH ENDOBRONCHIAL NAVIGATION;  Surgeon: Melrose Nakayama, MD;  Location: [facility];  Service: Thoracic;  Laterality: N/A;  . VIDEO BRONCHOSCOPY WITH ENDOBRONCHIAL ULTRASOUND N/A 03/27/2015   Procedure: VIDEO BRONCHOSCOPY WITH ENDOBRONCHIAL ULTRASOUND;  Surgeon: Melrose Nakayama, MD;  Location: [facility];  Service: Thoracic;  Laterality: N/A;    Social History:   reports that he quit smoking about 11 years ago. His smoking use included Cigarettes. He has a  100.00 pack-year smoking history. He has never used smokeless tobacco. He reports that he does not drink alcohol or use drugs.   No Known Allergies  History reviewed. No pertinent family history.    Prior to Admission medications   Medication Sig Start Date End Date Taking? Authorizing Provider  docusate sodium (COLACE) 100 MG capsule Take 200 mg by mouth 2 (two) times daily.   Yes [provider]  furosemide (LASIX) 20 MG tablet Take 20 mg by mouth.   Yes [provider]  gabapentin (NEURONTIN) 100 MG capsule TAKE THREE CAPSULES BY MOUTH THREE TIMES DAILY 03/06/16  Yes Delia Chimes A, MD  HYDROcodone-acetaminophen (NORCO) 7.5-325 MG tablet Take 1 tablet by mouth every 4 (four) hours as needed. 12/14/15  Yes Delia Chimes A, MD  hydrOXYzine (ATARAX/VISTARIL) 10 MG tablet Take 10 mg by mouth every 6 (six) hours as needed.   Yes [provider]  LORazepam (ATIVAN) 1 MG tablet Take 1 mg by mouth every 8 (eight) hours as needed for anxiety.   Yes [provider]  morphine (MS CONTIN) 30 MG 12 hr tablet Take 1 tablet (30 mg total) by mouth every 12 (twelve) hours. 09/24/15  Yes Tresa Garter, MD  morphine (MS CONTIN) 30 MG 12 hr tablet Take 30 mg by mouth every 12 (twelve) hours.   Yes [provider]  polyethylene glycol (MIRALAX / GLYCOLAX) packet Take 17 g by mouth daily.   Yes [provider]  prochlorperazine (COMPAZINE) 10 MG tablet TAKE ONE TABLET BY MOUTH EVERY 6 HOURS AS NEEDED FOR NAUSEA AND VOMITING 01/29/16  Yes Curt Bears, MD  dexamethasone (DECADRON) 4 MG tablet 4 mg by mouth twice a day the day before, day of and day after chemotherapy every 3 weeks Patient not taking: Reported on 12/14/2015 04/09/15   Curt Bears, MD  folic acid (FOLVITE) 1 MG tablet Take 1 tablet (1 mg total) by mouth daily. Patient not taking: Reported on 12/14/2015 04/09/15   Curt Bears, MD  hydrochlorothiazide (MICROZIDE) 12.5 MG capsule  Take 1 capsule (12.5 mg total) by mouth daily. Patient not taking: Reported on 12/14/2015 02/23/15   Tereasa Coop, PA-C    Physical Exam: BP (!) 120/54   Pulse 98   Resp 20   Ht 5\' 8"  (1.727 m)   Wt 68 kg (150 lb)   SpO2 99%   BMI 22.81 kg/m   GENERAL :   Awake, not oriented  HEAD:           normocephalic. EYES:            PERRL, EARS:           Hard hearing  THROAT:     Dry mouth  NECK:          supple, CARDIAC:    Normal S1 and S2. No gallop. Vascular:     no peripheral edema.  LUNGS:       Clear to auscultation  ABDOMEN: tender to palpation      EXT           : No significant deformity  Neuro        : awake, moves all extremities, respond to pain/withdraw           Labs on Admission:  Reviewed.   Radiological Exams on Admission: Dg Chest Portable 1 View  Result Date: 10/25/2016 CLINICAL DATA:  Bloody emesis and bloody stool. History of stage IV lung cancer. EXAM: PORTABLE CHEST 1 VIEW COMPARISON:  CT chest Jul 19, 2015 FINDINGS: Cardiac silhouette is mildly enlarged. Calcified aortic knob. Dense retrocardiac consolidation with moderate LEFT pleural effusion. Patchy RIGHT mid and lower lung zone airspace opacities. Pulmonary nodule LEFT midlung zone. Fullness of bilateral hila. No pneumothorax. Osteopenia. IMPRESSION: Large retrocardiac consolidation in part reflecting known hiatal hernia. Moderate LEFT pleural effusion, underlying mass possible given patient's history of cancer. Pulmonary nodule LEFT midlung zone. Fullness of the hila seen with vascular prominence and lymphadenopathy. Mild cardiomegaly. Pulmonary vascular congestion and patchy confluent edema versus pneumonia. Electronically Signed   By: Elon Alas M.D.   On: 10/29/2016 03:58      Assessment/Plan  Upper GI bleeding:  Pt to get 3 units of PRBC and repeat H&H post transfusion  Keep NPO for now Family refused invasive interventions including EGD/NG tube Family wants pt to be transferred to  hospice service. I contacted palliative care who offered to contact the patient's hospice service and arrange for transfer to hospice hospital bed.  Continue PPI drip for now.   AKI:  Likely prerenal  Continue IVF Repeat BMP in pm  Hyperkalemia:  Likely due to AKI / GIB Can't give kayexalate  Will give lasix 20 IV and consult nephrology to assist with management   Elevated liver enzymes and INR: likely related to metastatic disease.   Stage IV lung cancer: s/p chemo. Pt was already placed on hospice. Will arrange for transfer to hospice bed per family  wishes.   AMS: due to all of above with declining mental status recently.   Input & Output: NA DVT prophylaxis:  SCDs GI prophylaxis: PPI Consultants: palliative / nephrology Code Status: DNR/DNI  Family Communication: at bedside   Disposition Plan: TBD     Gennaro Africa M.D [facility]

## 2016-11-06 NOTE — ED Notes (Signed)
Bed: WA17 Expected date:  Expected time:  Means of arrival:  Comments: 76 yo M/GI Bleed

## 2016-11-06 NOTE — Consult Note (Signed)
Palliative care consult  Reason for consult: Goals of care in light of metastatic cancer and GI bleed  I saw and examined Derek Howard.  He is somnolent and does not arouse but appears comfortable on my examination.  His brother, Derek Howard, was present in the room. I met with Derek Howard and we discussed his care and the fact that I believe he is dying.  His brother reports that he also understands that this is the case and he has been working with the rest of his family (patient's wife and sons) to help them understand that he is approaching end-of-life.  He said that they have been told this, but he does not think that they have really accepted it.  He asked me to call and discuss with Derek Howard wife.  I called and was able to reach patient's wife, Derek Howard.  I discussed his clinical course with Derek Howard including initial plan for multiple units of blood and change to this plan as he has renal failure and was becoming fluid overloaded.  I shared with her that I am concerned that he is dying regardless of interventions and plan for no further blood at this time.    She asked if I thought he would die tonight.  I told her that I don't know this for certain either way, but I would not be surprised if he died, especially if he has any further bleeding.  I am not sure if she truly accepts this, but she was clear that goal is to ensure that he is comfortable.  She also asked if I felt he would be best served by residential hospice.    Plan for continuation of current care (protonix infusion) but no escalation of care.  Plan to meet in person tomorrow to discuss options moving forward (ie residential hospice).  Total time 75 minutes Greater than 50%  of this time was spent counseling and coordinating care related to the above assessment and plan.  Micheline Rough, MD [place] Team [phone]

## 2016-11-06 NOTE — ED Triage Notes (Signed)
Pt presents from home by EMS for evaluation of blood in emesis and stool. Pt is hospice for stage 4 lung cancer. Pt not able to answer questions appropriately.

## 2016-11-06 NOTE — ED Provider Notes (Signed)
[facility] DEPT Provider Note   CSN: [id number] Arrival date & time: 11/16/2016  0320     History   Chief Complaint Chief Complaint  Patient presents with  . GI Bleeding    HPI Derek Howard is a 76 y.o. male.  The history is provided by the EMS personnel. The history is limited by the condition of the patient.  Rectal Bleeding  Quality:  Black and tarry Amount:  Unable to specify Timing:  Unable to specify Chronicity:  New Context: not anal fissures and not constipation   Relieved by:  Nothing Worsened by:  Nothing Ineffective treatments:  None tried Associated symptoms comment:  Unknown  Risk factors: no anticoagulant use     Past Medical History:  Diagnosis Date  . Alcohol abuse    stopped 2013  . Cancer ([place])    basal cell carcinoma  . Encounter for antineoplastic chemotherapy 05/28/2015  . Hearing loss   . Hypertension   . Lung mass 03/10/2015    Patient Active Problem List   Diagnosis Date Noted  . 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 Encounter for antineoplastic chemotherapy 05/28/2015  . Non-small cell carcinoma of right lung, stage 4 ([place]) 04/09/2015  . Lung mass 03/10/2015  . Rash 04/24/2014  . Chronic low back pain 10/18/2012  . Osteoarthritis 05/29/2012    Past Surgical History:  Procedure Laterality Date  . HERNIA REPAIR    . VIDEO BRONCHOSCOPY WITH ENDOBRONCHIAL NAVIGATION N/A 03/27/2015   Procedure: VIDEO BRONCHOSCOPY WITH ENDOBRONCHIAL NAVIGATION;  Surgeon: Melrose Nakayama, MD;  Location: [facility];  Service: Thoracic;  Laterality: N/A;  . VIDEO BRONCHOSCOPY WITH ENDOBRONCHIAL ULTRASOUND N/A 03/27/2015   Procedure: VIDEO BRONCHOSCOPY WITH ENDOBRONCHIAL ULTRASOUND;  Surgeon: Melrose Nakayama, MD;  Location: [facility];  Service: Thoracic;  Laterality: N/A;       Home Medications    Prior to Admission medications   Medication Sig Start Date End Date Taking? Authorizing Provider  dexamethasone (DECADRON) 4 MG tablet 4 mg by  mouth twice a day the day before, day of and day after chemotherapy every 3 weeks Patient not taking: Reported on 12/14/2015 04/09/15   Curt Bears, MD  folic acid (FOLVITE) 1 MG tablet Take 1 tablet (1 mg total) by mouth daily. Patient not taking: Reported on 12/14/2015 04/09/15   Curt Bears, MD  gabapentin (NEURONTIN) 100 MG capsule TAKE THREE CAPSULES BY MOUTH THREE TIMES DAILY 03/06/16   Forrest Moron, MD  hydrochlorothiazide (MICROZIDE) 12.5 MG capsule Take 1 capsule (12.5 mg total) by mouth daily. Patient not taking: Reported on 12/14/2015 02/23/15   Tereasa Coop, PA-C  HYDROcodone-acetaminophen [facility]) 7.5-325 MG tablet Take 1 tablet by mouth every 4 (four) hours as needed. 12/14/15   Forrest Moron, MD  morphine (MS CONTIN) 30 MG 12 hr tablet Take 1 tablet (30 mg total) by mouth every 12 (twelve) hours. Patient not taking: Reported on 12/14/2015 09/24/15   Tresa Garter, MD  prochlorperazine (COMPAZINE) 10 MG tablet TAKE ONE TABLET BY MOUTH EVERY 6 HOURS AS NEEDED FOR NAUSEA AND VOMITING 01/29/16   Curt Bears, MD    Family History History reviewed. No pertinent family history.  Social History Social History  Substance Use Topics  . Smoking status: Former Smoker    Packs/day: 2.00    Years: 50.00    Types: Cigarettes    Quit date: 03/23/2005  . Smokeless tobacco: Never Used  . Alcohol use No     Comment:  stopped drinking 2013     Allergies   Patient has no known allergies.   Review of Systems Review of Systems  Unable to perform ROS: Acuity of condition  Gastrointestinal: Positive for anal bleeding and hematochezia.     Physical Exam Updated Vital Signs BP (!) 119/57   Pulse 94   Resp (!) 27   Ht 5\' 8"  (1.727 m)   Wt 68 kg (150 lb)   SpO2 90%   BMI 22.81 kg/m   Physical Exam  Constitutional: He appears well-developed.  HENT:  Head: Normocephalic and atraumatic.  Mouth/Throat: No oropharyngeal exudate.  Basal cell carcinoma of the  nose  Eyes: Pupils are equal, round, and reactive to light. Conjunctivae are normal.  Neck: Normal range of motion. Neck supple. No JVD present.  Cardiovascular: Normal rate, regular rhythm, normal heart sounds and intact distal pulses.   Pulmonary/Chest: Effort normal. No stridor. No respiratory distress. He has decreased breath sounds in the left middle field and the left lower field.  Abdominal: He exhibits fluid wave and ascites. There is no tenderness. There is no rigidity, no guarding, no tenderness at McBurney's point and negative Murphy's sign.  Musculoskeletal: He exhibits no edema or deformity.  Neurological: He is alert. He displays normal reflexes.  Skin: Capillary refill takes less than 2 seconds. He is not diaphoretic. There is pallor.  Psychiatric:  unable     ED Treatments / Results   Vitals:   11/14/2016 0500 11/18/2016 0542  BP: (!) 119/57 (!) 119/55  Pulse: 94 94  Resp: (!) 27 (!) 25  SpO2: 90% 98%    Labs (all labs ordered are listed, but only abnormal results are displayed)  Results for orders placed or performed during the hospital encounter of 10/23/2016  CBC with Differential/Platelet  Result Value Ref Range   WBC 7.5 4.0 - 10.5 K/uL   RBC 2.57 (L) 4.22 - 5.81 MIL/uL   Hemoglobin 4.3 (LL) 13.0 - 17.0 g/dL   HCT 17.6 (L) 39.0 - 52.0 %   MCV 68.5 (L) 78.0 - 100.0 fL   MCH 16.7 (L) 26.0 - 34.0 pg   MCHC 24.4 (L) 30.0 - 36.0 g/dL   RDW 23.9 (H) 11.5 - 15.5 %   Platelets 354 150 - 400 K/uL   Neutrophils Relative % PENDING %   Neutro Abs PENDING 1.7 - 7.7 K/uL   Band Neutrophils PENDING %   Lymphocytes Relative PENDING %   Lymphs Abs PENDING 0.7 - 4.0 K/uL   Monocytes Relative PENDING %   Monocytes Absolute PENDING 0.1 - 1.0 K/uL   Eosinophils Relative PENDING %   Eosinophils Absolute PENDING 0.0 - 0.7 K/uL   Basophils Relative PENDING %   Basophils Absolute PENDING 0.0 - 0.1 K/uL   WBC Morphology PENDING    RBC Morphology PENDING    Smear Review  PENDING    nRBC PENDING 0 /100 WBC   Metamyelocytes Relative PENDING %   Myelocytes PENDING %   Promyelocytes Absolute PENDING %   Blasts PENDING %  Comprehensive metabolic panel  Result Value Ref Range   Sodium 140 135 - 145 mmol/L   Potassium 5.5 (H) 3.5 - 5.1 mmol/L   Chloride 99 (L) 101 - 111 mmol/L   CO2 24 22 - 32 mmol/L   Glucose, Bld 97 65 - 99 mg/dL   BUN 116 (H) 6 - 20 mg/dL   Creatinine, Ser 3.04 (H) 0.61 - 1.24 mg/dL   Calcium 8.0 (L) 8.9 - 10.3  mg/dL   Total Protein 6.0 (L) 6.5 - 8.1 g/dL   Albumin 2.6 (L) 3.5 - 5.0 g/dL   AST 421 (H) 15 - 41 U/L   ALT 365 (H) 17 - 63 U/L   Alkaline Phosphatase 194 (H) 38 - 126 U/L   Total Bilirubin 1.0 0.3 - 1.2 mg/dL   GFR calc non Af Amer 18 (L) >60 mL/min   GFR calc Af Amer 21 (L) >60 mL/min   Anion gap 17 (H) 5 - 15  Ammonia  Result Value Ref Range   Ammonia 26 9 - 35 umol/L  Protime-INR  Result Value Ref Range   Prothrombin Time 23.9 (H) 11.4 - 15.2 seconds   INR 2.16   POC occult blood, ED  Result Value Ref Range   Fecal Occult Bld POSITIVE (A) NEGATIVE  Type and screen  Result Value Ref Range   ABO/RH(D) A POS    Antibody Screen NEG    Sample Expiration 11/09/2016   ABO/Rh  Result Value Ref Range   ABO/RH(D) A POS    Dg Chest Portable 1 View  Result Date: 10/26/2016 CLINICAL DATA:  Bloody emesis and bloody stool. History of stage IV lung cancer. EXAM: PORTABLE CHEST 1 VIEW COMPARISON:  CT chest Jul 19, 2015 FINDINGS: Cardiac silhouette is mildly enlarged. Calcified aortic knob. Dense retrocardiac consolidation with moderate LEFT pleural effusion. Patchy RIGHT mid and lower lung zone airspace opacities. Pulmonary nodule LEFT midlung zone. Fullness of bilateral hila. No pneumothorax. Osteopenia. IMPRESSION: Large retrocardiac consolidation in part reflecting known hiatal hernia. Moderate LEFT pleural effusion, underlying mass possible given patient's history of cancer. Pulmonary nodule LEFT midlung zone. Fullness of  the hila seen with vascular prominence and lymphadenopathy. Mild cardiomegaly. Pulmonary vascular congestion and patchy confluent edema versus pneumonia. Electronically Signed   By: Elon Alas M.D.   On: 11/13/2016 03:58     Radiology Dg Chest Portable 1 View  Result Date: 11/18/2016 CLINICAL DATA:  Bloody emesis and bloody stool. History of stage IV lung cancer. EXAM: PORTABLE CHEST 1 VIEW COMPARISON:  CT chest Jul 19, 2015 FINDINGS: Cardiac silhouette is mildly enlarged. Calcified aortic knob. Dense retrocardiac consolidation with moderate LEFT pleural effusion. Patchy RIGHT mid and lower lung zone airspace opacities. Pulmonary nodule LEFT midlung zone. Fullness of bilateral hila. No pneumothorax. Osteopenia. IMPRESSION: Large retrocardiac consolidation in part reflecting known hiatal hernia. Moderate LEFT pleural effusion, underlying mass possible given patient's history of cancer. Pulmonary nodule LEFT midlung zone. Fullness of the hila seen with vascular prominence and lymphadenopathy. Mild cardiomegaly. Pulmonary vascular congestion and patchy confluent edema versus pneumonia. Electronically Signed   By: Elon Alas M.D.   On: 10/22/2016 03:58    Procedures Procedures (including critical care time)  Medications Ordered in ED Medications  sodium chloride 0.9 % bolus 1,000 mL (not administered)  0.9 %  sodium chloride infusion (not administered)  famotidine (PEPCID) IVPB 20 mg premix (0 mg Intravenous Stopped 11/15/2016 0447)     Per nurse at hospice the patient is a DNR/I but she does not know what a POLST is.  She is not sure when the patient was last seen  Final Clinical Impressions(s) / ED Diagnoses   Final diagnoses:  Primary malignant neoplasm of lung metastatic to other site, unspecified laterality ([place])  Melena  Uremia  Acute renal failure with tubular necrosis [facility])  Gynecomastia   Will admit to medicine    Kenneth Cuaresma, MD 11/03/2016 3235

## 2016-11-07 DIAGNOSIS — C3490 Malignant neoplasm of unspecified part of unspecified bronchus or lung: Secondary | ICD-10-CM

## 2016-11-07 LAB — GLUCOSE, CAPILLARY: GLUCOSE-CAPILLARY: 106 mg/dL — AB (ref 65–99)

## 2016-11-07 NOTE — Progress Notes (Signed)
[facility] Progress Note  Patient: Derek Howard MBW:[id number]   PCP: Forrest Moron, MD DOB: 1941-01-07   DOA: 11/15/2016   DOS: 11/07/2016   Date of Service: the patient was seen and examined on 11/07/2016  Subjective: Patient nonverbal, lethargic, moaning and groaning.  Brief hospital course: Pt. with PMH of stage IV non-small cell lung cancer, history of alcohol abuse, hypertension; admitted on 10/28/2016, presented with complaint of melena and hematemesis, was found to have melena. Currently further plan is comfort care with eventual transfer to residential hospice.  Assessment and Plan: 1. Melena. No further episode of bleeding here in the hospital Currently on Protonix drip per family request. Hospice is following. Inpatient hospice recommended. No beds available to right now. Will monitor.  2. Anxiety and pain control. Continue when necessary morphine when necessary Ativan.  Diet: comfort feeds  Advance goals of care discussion: comfort care  Family Communication: no family was present at bedside, at the time of interview.  Disposition:  Discharge to inpatient hospice.  Consultants: Palliative care  Procedures: none  Antibiotics: Anti-infectives    None       Objective: Physical Exam: Vitals:   10/29/2016 1255 10/24/2016 2238 11/07/16 0625 11/07/16 1400  BP: 110/65 (!) 120/55 111/60 (!) 109/52  Pulse: 75 88 97   Resp: 16 17 18 17   Temp: 98 F (36.7 C) 97.8 F (36.6 C) 97.9 F (36.6 C) 98 F (36.7 C)  TempSrc: Axillary Axillary Axillary Axillary  SpO2: 90% 98% 93% 96%  Weight:      Height:        Intake/Output Summary (Last 24 hours) at 11/07/16 1753 Last data filed at 11/07/16 1400  Gross per 24 hour  Intake                0 ml  Output                1 ml  Net               -1 ml   Filed Weights   11/10/2016 0332  Weight: 68 kg (150 lb)   General: Appear in moderate distress, no Rash; Oral Mucosa moist no Abnormal Mass Or  lumps Cardiovascular: S1 and S2 Present, no Murmur, Respiratory: normal respiratory effort, Bilateral Air entry present bilateral Crackles,no wheezes Abdomen: Bowel Sound present, Extremities: no Pedal edema, no calf tenderness Neurology: Lethargic, not following command, nonverbal. Data Reviewed: CBC:  Recent Labs Lab 10/28/2016 0402 11/10/2016 1944  WBC 7.5  --   NEUTROABS 6.0  --   HGB 4.3* 5.6*  HCT 17.6* 20.7*  MCV 68.5*  --   PLT 354  --    Basic Metabolic Panel:  Recent Labs Lab 10/30/2016 0402 11/01/2016 1944  NA 140 145  K 5.5* 5.1  CL 99* 105  CO2 24 34*  GLUCOSE 97 102*  BUN 116* 105*  CREATININE 3.04* 2.42*  CALCIUM 8.0* 8.1*    Liver Function Tests:  Recent Labs Lab 11/07/2016 0402  AST 421*  ALT 365*  ALKPHOS 194*  BILITOT 1.0  PROT 6.0*  ALBUMIN 2.6*   No results for input(s): LIPASE, AMYLASE in the last 168 hours.  Recent Labs Lab 11/17/2016 0402  AMMONIA 26   Coagulation Profile:  Recent Labs Lab 11/11/2016 0402  INR 2.16   Cardiac Enzymes: No results for input(s): CKTOTAL, CKMB, CKMBINDEX, TROPONINI in the last 168 hours. BNP (last 3 results) No results for input(s): PROBNP in the last 8760  hours. CBG:  Recent Labs Lab 11/07/16 0749  GLUCAP 106*   Studies: No results found.  Scheduled Meds: . [START ON 11/09/2016] pantoprazole  40 mg Intravenous Q12H   Continuous Infusions: . pantoprozole (PROTONIX) infusion 8 mg/hr (11/14/2016 2359)   PRN Meds: LORazepam, morphine injection  Time spent: 30 minutes  Author: Berle Mull, MD [facility] Pager: [phone] 11/07/2016 5:53 PM  If 7PM-7AM, please contact night-coverage at [email or web address], password [facility]

## 2016-11-07 NOTE — Progress Notes (Signed)
Patient is on service with [facility].  Discussed with [facility] liaison this AM and [facility] social work to follow up with family regarding possible transfer to [facility].    As this appears to now be predominantly a disposition issue, palliative will not round on Mr. General unless notified of any needs regarding his care.  Please call our team at [phone] if we can be of further assistance in the care of Mr. Nordin.  Micheline Rough, MD [facility] Team [phone]   NO CHARGE NOTE

## 2016-11-07 NOTE — Progress Notes (Signed)
Palliative Medicine RN Note: Discussed in rounds. Family interested in [facility]. Called Harmon Pier with [facility] to notify them of family's interest. This patient is active with and already admitted to [facility].  Marjie Skiff Trichelle Lehan, RN, BSN, [facility] 11/07/2016 9:23 AM Cell [phone] 8:00-4:00 Monday-Friday Office [phone]

## 2016-11-07 NOTE — Progress Notes (Signed)
CSW following to assist with dc planning. [facility] CSW contacted CSW to report family is requesting [facility] placement. Unfortunately, there is no availability today. CSW will speak with [facility]  in the am to check bed availability at [facility]. Attending has been updated.  Werner Lean LCSW [phone]

## 2016-11-07 NOTE — Progress Notes (Signed)
[facility]  [facility] RN Visit @ 9:20 AM   This is a related and covered GIP admission of 11/19/2016 with [facility] diagnosis of Lung Cancer per Dr. Lyman Speller. Code status DNR. Family activated EMS after notifying [facility]. Patient was experiencing vomiting and diarrhea with blood.   Patient seen in room, with no family/visitors present at time of visit. Patient appears comfortable.  He does not respond to verbal or tactile stimuli.  Patient is on 6L [place] with normal respirations noted.  Patient appears pale.  Patient has received a total of 4 mg Morphine the past 24 hours for pain.  He is receiving 40 mg Protonix Q 12 hours IV via a PIV. Patient has had no po intake the past 24 hours, per chart review.  Spoke to [employer], [facility] homecare SW, who plans to speak with family today regarding potential transfer to [facility].    [facility] will continue to follow and anticipate needs.   Please call with any hospice-related questions.  Thank you,  Freddi Starr RN, BSN [facility] Liaison  [place] are on [place].

## 2016-11-10 LAB — TYPE AND SCREEN
ABO/RH(D): A POS
ANTIBODY SCREEN: NEGATIVE
UNIT DIVISION: 0
UNIT DIVISION: 0
Unit division: 0

## 2016-11-10 LAB — BPAM RBC
Blood Product Expiration Date: 201810032359
Blood Product Expiration Date: 201810032359
Blood Product Expiration Date: 201810032359
ISSUE DATE / TIME: 201809161014
UNIT TYPE AND RH: 6200
Unit Type and Rh: 6200
Unit Type and Rh: 6200

## 2016-11-21 NOTE — Progress Notes (Signed)
Called to room by nurse tech. Pt is unresponsive , without respiration and no heart beat heard . Dr Posey Pronto notified and came to pronounce. Wife- Marlowe Kays notified and states will be in. She request body be sent to [facility] after their visit. [facility] called and states body may be released to funeral home.

## 2016-11-21 NOTE — Progress Notes (Signed)
Plan for d/c to hospice facility, discharge planning per CSW. [phone]

## 2016-11-21 NOTE — Progress Notes (Signed)
   11/19/2016 1200  Clinical Encounter Type  Visited With Family  Visit Type Initial;Death  Referral From Nurse  Consult/Referral To Chaplain  Spiritual Encounters  Spiritual Needs Grief support;Prayer  Stress Factors  Family Stress Factors Loss;Family relationships   I visited with the family per referral from the Charge Nurse who notified me that the patient had passed today.  The patient's wife and son were at the bedside.  They requested prayer. We prayed at the bedside.  No other support was needed.  Please, contact Spiritual Care for further assistance.   [place] M.Div.

## 2016-11-21 DEATH — deceased

## 2016-12-02 ENCOUNTER — Other Ambulatory Visit: Payer: Self-pay | Admitting: Nurse Practitioner

## 2017-02-23 NOTE — Telephone Encounter (Signed)
Patient deceased

## 2017-10-03 IMAGING — CT CT CHEST W/ CM
2 of 5 series · 12 of 36 positions shown, 15 images · IV contrast (iopamidol)
Comparison: 06/22/2015 CT chest, abdomen and pelvis.

CLINICAL DATA: 75-year-old male presents for restaging of stage IV
non-small cell lung carcinoma of the right upper lobe diagnosed
March 2015, completed chemotherapy July 2015.

EXAM:
CT CHEST, ABDOMEN, AND PELVIS WITH CONTRAST
TECHNIQUE: Multidetector CT imaging of the chest, abdomen and pelvis was
performed following the standard protocol during bolus
administration of intravenous contrast.
CONTRAST:  100mL [id number] IOPAMIDOL ([id number]) INJECTION 61%

[Series 2: c/a/p with · axial · 0.80mm/px · z∈[-123,+387]mm · 9 of 128 slices shown, 12 images]
[im 13/128  mediastinal]
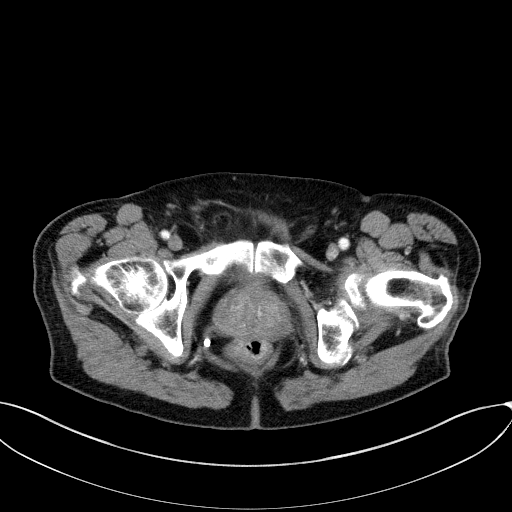
[im 13/128  lung]
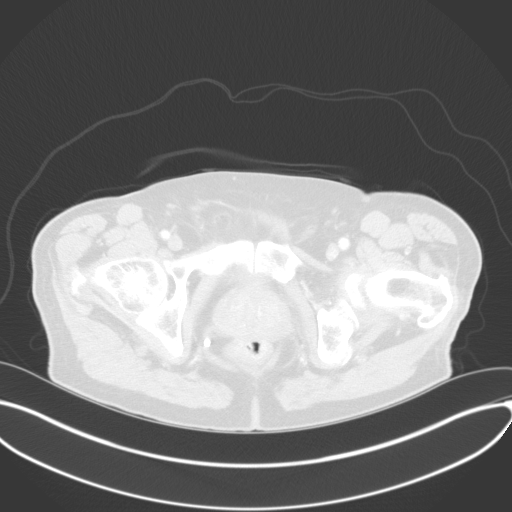
[im 26/128  lung]
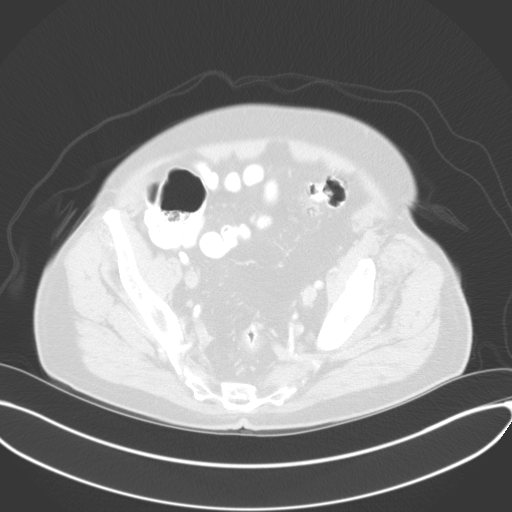
[im 39/128  lung]
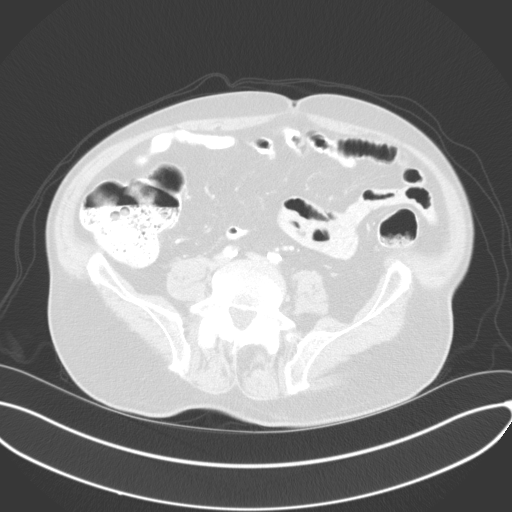
[im 51/128  lung]
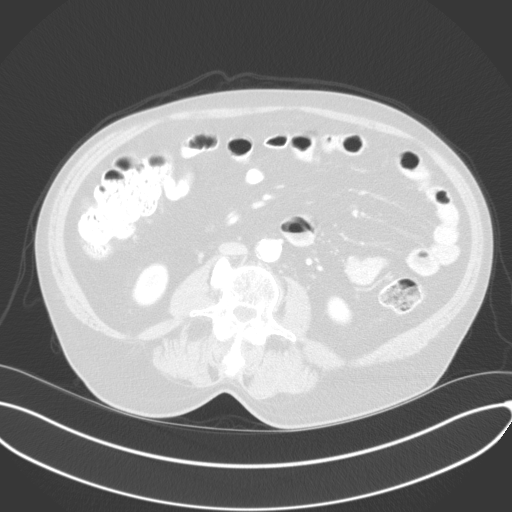
[im 64/128  mediastinal]
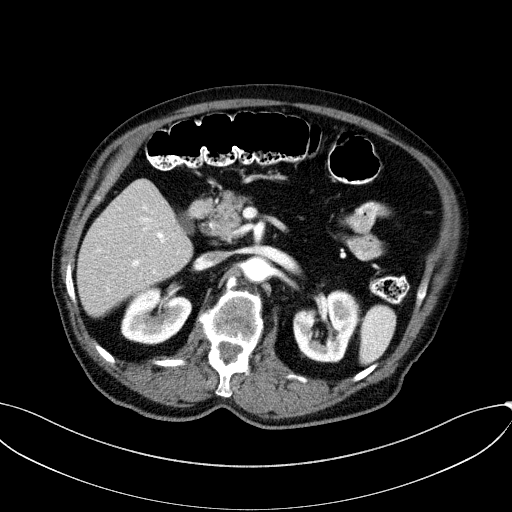
[im 64/128  lung]
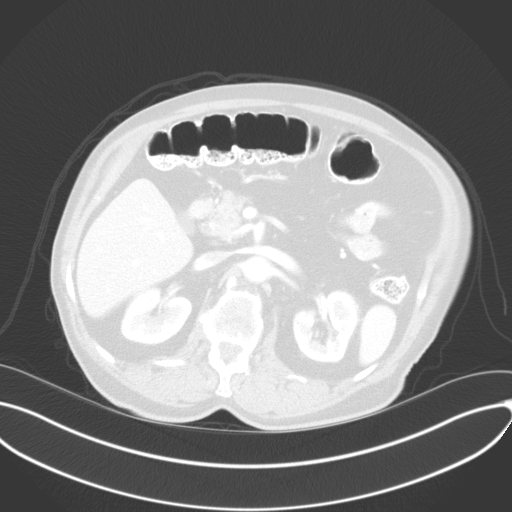
[im 77/128  lung]
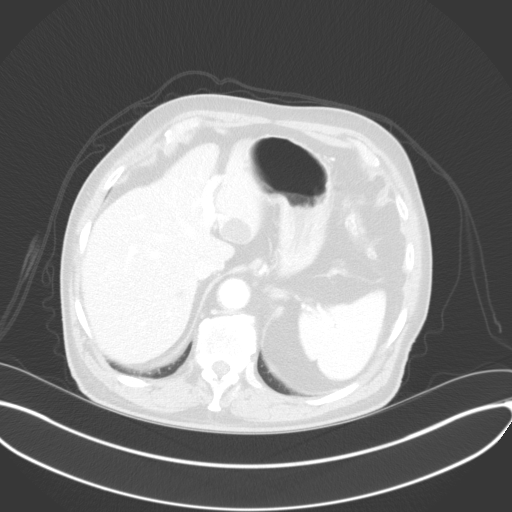
[im 89/128  lung]
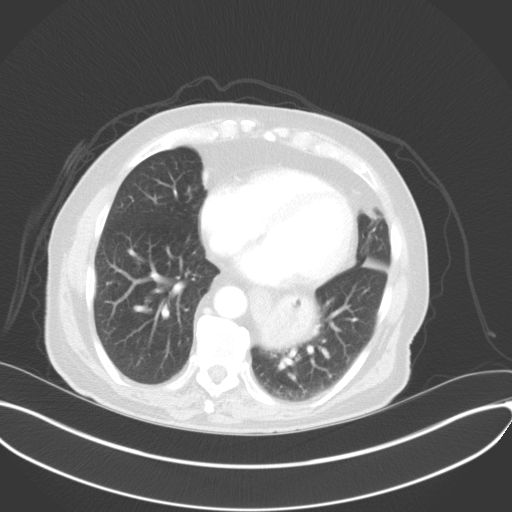
[im 102/128  lung]
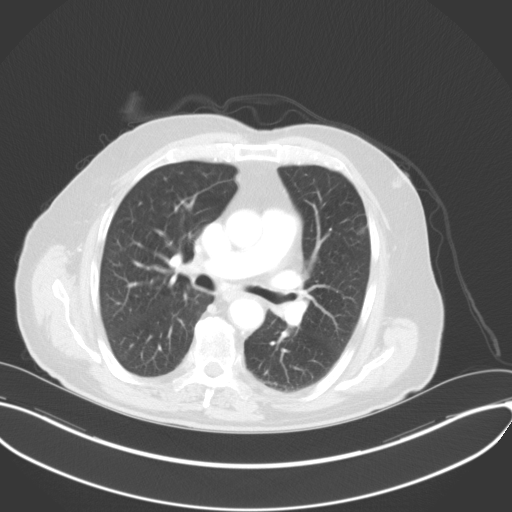
[im 115/128  mediastinal]
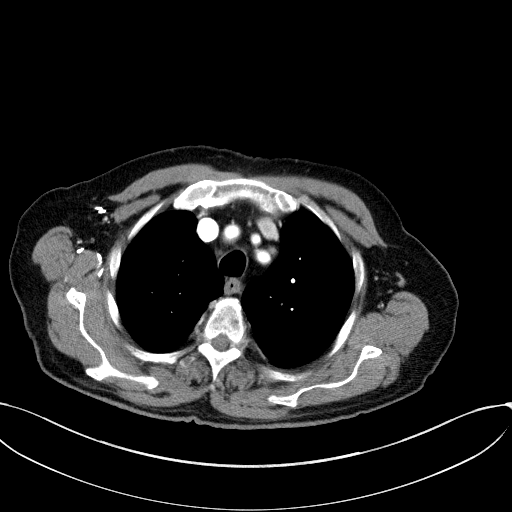
[im 115/128  lung]
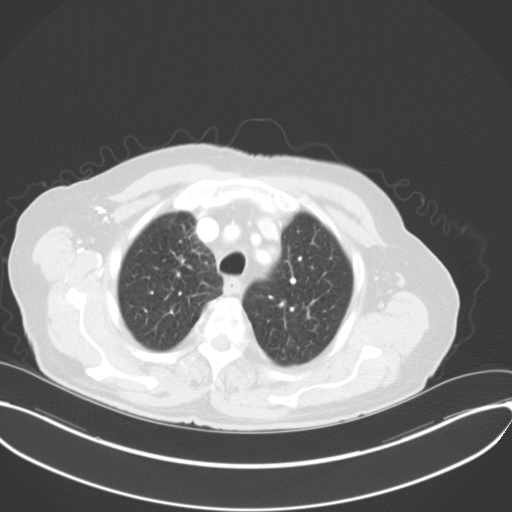

[Series 3: coronal · coronal · 0.89mm/px · 3 of 136 slices shown]
[im 28/136  lung]
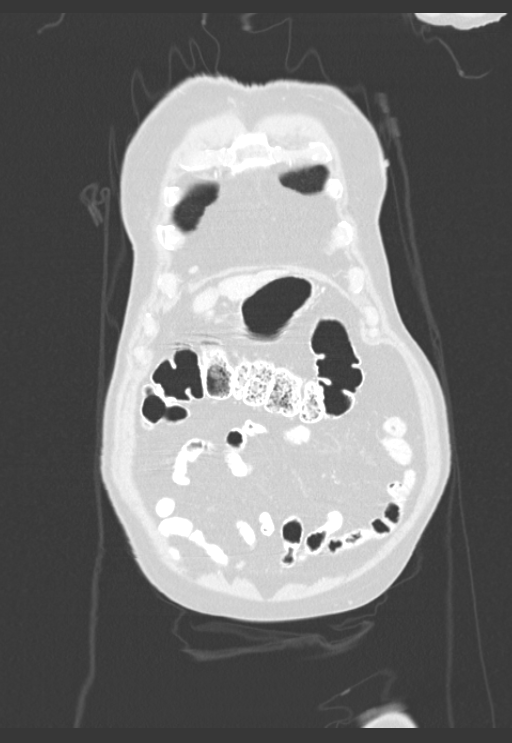
[im 55/136  lung]
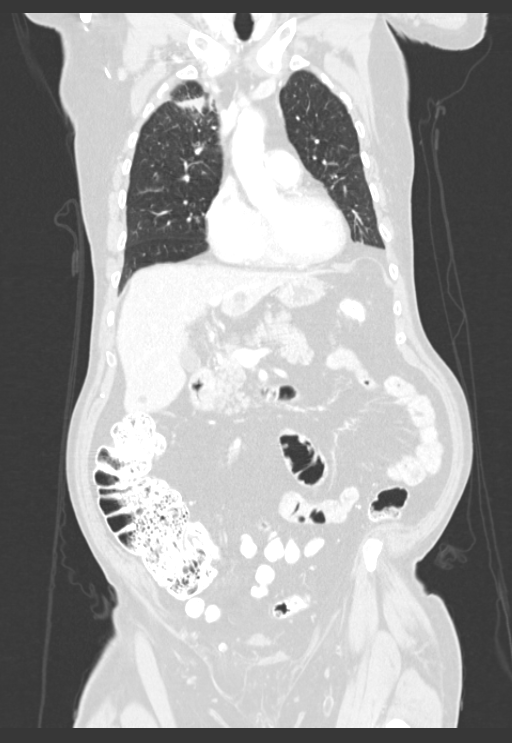
[im 82/136  lung]
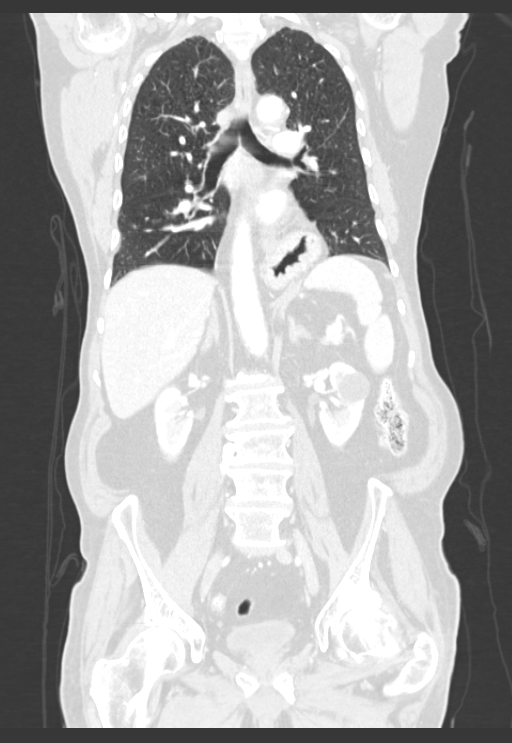

[12 of 36 positions shown; findings below may reference images not displayed]

FINDINGS: CT CHEST

Mediastinum/Nodes: Normal heart size. No significant pericardial
fluid/thickening. Coronary atherosclerosis. Atherosclerotic
nonaneurysmal thoracic aorta. Normal caliber pulmonary arteries. No
central pulmonary emboli. No discrete thyroid nodules. Unremarkable
esophagus. Mildly enlarged 1.0 cm right paratracheal node (series
2/image 20), previously 1.1 cm, not appreciably changed. Separate
mildly enlarged 1.2 cm right lower paratracheal node (series 2/
image 23), previously 1.2 cm, unchanged. No additional
pathologically enlarged axillary, mediastinal or hilar nodes.

Lungs/Pleura: No pneumothorax. No pleural effusion. Central right
upper lobe spiculated 3.9 x 3.5 cm lung mass (series 6/ image 40),
previously 3.7 x 3.1 cm, mildly increased in size, abutting the
medial pleura. Moderate centrilobular emphysema and diffuse
bronchial wall thickening. Subpleural anterior right middle lobe 4
mm pulmonary nodule (series 6/image 83) is stable since 03/05/2015.
No acute consolidative airspace disease or new significant pulmonary
nodules. Stable mild compressive atelectasis in the medial left
lower lobe.

Musculoskeletal: Lytic posterior left sixth rib 3.5 cm metastasis
(series 6/image 40) is mildly increased from 3.1 cm. No new focal
bone lesions in the chest. Moderate thoracic spondylosis.

CT ABDOMEN AND PELVIS

Hepatobiliary: Simple 2.2 cm posterior lateral segment left liver
lobe cyst. At least 10 subcentimeter hypodense liver lesions
scattered throughout the liver are too small to characterize and are
not appreciably changed. No new liver lesions. Normal gallbladder
with no radiopaque cholelithiasis. No biliary ductal dilatation.

Pancreas: Normal, with no mass or duct dilation.

Spleen: Normal size. No mass.

Adrenals/Urinary Tract: Normal right adrenal. Left adrenal 1.9 cm
nodule is stable since 03/05/2015 and was characterized as a benign
adenoma on the 03/26/2015 PET-CT. No hydronephrosis. Simple 1.2 cm
renal cyst in the medial interpolar right kidney. Simple 2.9 cm
renal cyst in the interpolar left kidney. Relatively collapsed and
grossly normal bladder.

Stomach/Bowel: Moderate hiatal hernia. Otherwise grossly normal
stomach. Normal caliber small bowel with no small bowel wall
thickening. Normal appendix. Mild sigmoid diverticulosis, with no
large bowel wall thickening or pericolonic fat stranding. Oral
contrast progresses to the rectum.

Vascular/Lymphatic: Atherosclerotic nonaneurysmal abdominal aorta.
Patent portal, splenic, hepatic and renal veins. No pathologically
enlarged lymph nodes in the abdomen or pelvis.

Reproductive: Stable moderately enlarged prostate.

Other: No pneumoperitoneum, ascites or focal fluid collection.
Stable small fat containing right inguinal hernia.

Musculoskeletal: No aggressive appearing focal osseous lesions.
Mild-to-moderate lumbar spondylosis.
IMPRESSION: 1. Interval growth of spiculated central right upper lobe lung mass.
2. Stable mild right paratracheal mediastinal adenopathy.
3. Lytic bone metastasis in the left posterior sixth rib has mildly
increased in size.
4. No new sites of metastatic disease in the chest, abdomen or
pelvis .
5. Additional findings include aortic atherosclerosis, coronary
atherosclerosis, moderate emphysema, left adrenal adenoma, moderate
hiatal hernia, mild sigmoid diverticulosis, moderate prostatomegaly
and small fat containing right inguinal hernia.
